# Patient Record
Sex: Female | Born: 1955 | Race: White | Hispanic: No | Marital: Married | Smoking: Never smoker
Health system: Southern US, Community
[De-identification: ages and names within clinical notes are randomized; demographics above are authoritative.]

## PROBLEM LIST (undated history)

## (undated) DIAGNOSIS — I10 Essential (primary) hypertension: Secondary | ICD-10-CM

## (undated) DIAGNOSIS — N289 Disorder of kidney and ureter, unspecified: Secondary | ICD-10-CM

## (undated) DIAGNOSIS — Z87442 Personal history of urinary calculi: Secondary | ICD-10-CM

## (undated) DIAGNOSIS — N189 Chronic kidney disease, unspecified: Secondary | ICD-10-CM

## (undated) DIAGNOSIS — H9190 Unspecified hearing loss, unspecified ear: Secondary | ICD-10-CM

## (undated) DIAGNOSIS — E7800 Pure hypercholesterolemia, unspecified: Secondary | ICD-10-CM

## (undated) DIAGNOSIS — G5600 Carpal tunnel syndrome, unspecified upper limb: Secondary | ICD-10-CM

## (undated) DIAGNOSIS — F419 Anxiety disorder, unspecified: Secondary | ICD-10-CM

## (undated) DIAGNOSIS — R112 Nausea with vomiting, unspecified: Secondary | ICD-10-CM

## (undated) DIAGNOSIS — Z9889 Other specified postprocedural states: Secondary | ICD-10-CM

## (undated) DIAGNOSIS — M810 Age-related osteoporosis without current pathological fracture: Secondary | ICD-10-CM

## (undated) HISTORY — DX: Chronic kidney disease, unspecified: N18.9

## (undated) HISTORY — PX: ESOPHAGOGASTRODUODENOSCOPY: SHX1529

## (undated) HISTORY — PX: TONSILLECTOMY: SUR1361

## (undated) HISTORY — PX: FOOT SURGERY: SHX648

## (undated) HISTORY — PX: KNEE SURGERY: SHX244

## (undated) HISTORY — PX: COLONOSCOPY: SHX174

## (undated) HISTORY — PX: ABDOMINAL HYSTERECTOMY: SHX81

## (undated) HISTORY — DX: Age-related osteoporosis without current pathological fracture: M81.0

## (undated) HISTORY — PX: CARPAL TUNNEL RELEASE: SHX101

## (undated) HISTORY — PX: APPENDECTOMY: SHX54

## (undated) HISTORY — PX: CHOLECYSTECTOMY: SHX55

## (undated) HISTORY — PX: OTHER SURGICAL HISTORY: SHX169

---

## 1998-01-29 ENCOUNTER — Emergency Department (HOSPITAL_COMMUNITY): Admission: EM | Admit: 1998-01-29 | Discharge: 1998-01-29 | Payer: Self-pay | Admitting: Emergency Medicine

## 1998-02-22 ENCOUNTER — Emergency Department (HOSPITAL_COMMUNITY): Admission: EM | Admit: 1998-02-22 | Discharge: 1998-02-22 | Payer: Self-pay | Admitting: Emergency Medicine

## 1998-04-29 ENCOUNTER — Emergency Department (HOSPITAL_COMMUNITY): Admission: EM | Admit: 1998-04-29 | Discharge: 1998-04-29 | Payer: Self-pay | Admitting: Emergency Medicine

## 1998-05-28 ENCOUNTER — Emergency Department (HOSPITAL_COMMUNITY): Admission: EM | Admit: 1998-05-28 | Discharge: 1998-05-28 | Payer: Self-pay | Admitting: Emergency Medicine

## 1998-06-10 ENCOUNTER — Emergency Department (HOSPITAL_COMMUNITY): Admission: EM | Admit: 1998-06-10 | Discharge: 1998-06-10 | Payer: Self-pay | Admitting: Emergency Medicine

## 1998-09-17 ENCOUNTER — Emergency Department (HOSPITAL_COMMUNITY): Admission: EM | Admit: 1998-09-17 | Discharge: 1998-09-17 | Payer: Self-pay | Admitting: Emergency Medicine

## 1999-01-01 ENCOUNTER — Emergency Department (HOSPITAL_COMMUNITY): Admission: EM | Admit: 1999-01-01 | Discharge: 1999-01-01 | Payer: Self-pay | Admitting: Emergency Medicine

## 2001-02-22 ENCOUNTER — Emergency Department (HOSPITAL_COMMUNITY): Admission: EM | Admit: 2001-02-22 | Discharge: 2001-02-22 | Payer: Self-pay | Admitting: Emergency Medicine

## 2001-03-19 ENCOUNTER — Emergency Department (HOSPITAL_COMMUNITY): Admission: EM | Admit: 2001-03-19 | Discharge: 2001-03-19 | Payer: Self-pay | Admitting: Emergency Medicine

## 2001-03-19 ENCOUNTER — Encounter: Payer: Self-pay | Admitting: Emergency Medicine

## 2001-03-29 ENCOUNTER — Emergency Department (HOSPITAL_COMMUNITY): Admission: EM | Admit: 2001-03-29 | Discharge: 2001-03-29 | Payer: Self-pay | Admitting: Emergency Medicine

## 2001-03-29 ENCOUNTER — Encounter: Payer: Self-pay | Admitting: Emergency Medicine

## 2001-05-30 ENCOUNTER — Emergency Department (HOSPITAL_COMMUNITY): Admission: EM | Admit: 2001-05-30 | Discharge: 2001-05-30 | Payer: Self-pay | Admitting: Emergency Medicine

## 2001-06-04 ENCOUNTER — Emergency Department (HOSPITAL_COMMUNITY): Admission: EM | Admit: 2001-06-04 | Discharge: 2001-06-04 | Payer: Self-pay | Admitting: Emergency Medicine

## 2001-09-18 ENCOUNTER — Encounter: Payer: Self-pay | Admitting: Emergency Medicine

## 2001-09-18 ENCOUNTER — Emergency Department (HOSPITAL_COMMUNITY): Admission: EM | Admit: 2001-09-18 | Discharge: 2001-09-18 | Payer: Self-pay | Admitting: Emergency Medicine

## 2001-09-30 ENCOUNTER — Emergency Department (HOSPITAL_COMMUNITY): Admission: EM | Admit: 2001-09-30 | Discharge: 2001-09-30 | Payer: Self-pay | Admitting: *Deleted

## 2001-09-30 ENCOUNTER — Encounter: Payer: Self-pay | Admitting: Emergency Medicine

## 2001-10-28 ENCOUNTER — Ambulatory Visit (HOSPITAL_COMMUNITY): Admission: RE | Admit: 2001-10-28 | Discharge: 2001-10-28 | Payer: Self-pay | Admitting: *Deleted

## 2001-10-28 ENCOUNTER — Encounter: Payer: Self-pay | Admitting: Specialist

## 2003-04-02 ENCOUNTER — Emergency Department (HOSPITAL_COMMUNITY): Admission: EM | Admit: 2003-04-02 | Discharge: 2003-04-02 | Payer: Self-pay | Admitting: *Deleted

## 2003-07-05 ENCOUNTER — Emergency Department (HOSPITAL_COMMUNITY): Admission: EM | Admit: 2003-07-05 | Discharge: 2003-07-05 | Payer: Self-pay | Admitting: Emergency Medicine

## 2003-09-10 ENCOUNTER — Emergency Department (HOSPITAL_COMMUNITY): Admission: EM | Admit: 2003-09-10 | Discharge: 2003-09-10 | Payer: Self-pay | Admitting: Emergency Medicine

## 2003-10-21 ENCOUNTER — Emergency Department (HOSPITAL_COMMUNITY): Admission: EM | Admit: 2003-10-21 | Discharge: 2003-10-21 | Payer: Self-pay | Admitting: Emergency Medicine

## 2004-08-04 ENCOUNTER — Emergency Department (HOSPITAL_COMMUNITY): Admission: EM | Admit: 2004-08-04 | Discharge: 2004-08-04 | Payer: Self-pay | Admitting: Emergency Medicine

## 2005-11-12 ENCOUNTER — Emergency Department (HOSPITAL_COMMUNITY): Admission: EM | Admit: 2005-11-12 | Discharge: 2005-11-12 | Payer: Self-pay | Admitting: Emergency Medicine

## 2005-12-07 ENCOUNTER — Emergency Department (HOSPITAL_COMMUNITY): Admission: EM | Admit: 2005-12-07 | Discharge: 2005-12-07 | Payer: Self-pay | Admitting: Emergency Medicine

## 2006-01-08 ENCOUNTER — Emergency Department (HOSPITAL_COMMUNITY): Admission: EM | Admit: 2006-01-08 | Discharge: 2006-01-08 | Payer: Self-pay | Admitting: *Deleted

## 2006-04-24 ENCOUNTER — Emergency Department (HOSPITAL_COMMUNITY): Admission: EM | Admit: 2006-04-24 | Discharge: 2006-04-24 | Payer: Self-pay | Admitting: Emergency Medicine

## 2006-06-09 ENCOUNTER — Emergency Department (HOSPITAL_COMMUNITY): Admission: EM | Admit: 2006-06-09 | Discharge: 2006-06-09 | Payer: Self-pay | Admitting: Emergency Medicine

## 2006-12-17 ENCOUNTER — Emergency Department (HOSPITAL_COMMUNITY): Admission: EM | Admit: 2006-12-17 | Discharge: 2006-12-17 | Payer: Self-pay | Admitting: Emergency Medicine

## 2006-12-27 ENCOUNTER — Emergency Department (HOSPITAL_COMMUNITY): Admission: EM | Admit: 2006-12-27 | Discharge: 2006-12-27 | Payer: Self-pay | Admitting: Emergency Medicine

## 2007-01-15 ENCOUNTER — Emergency Department (HOSPITAL_COMMUNITY): Admission: EM | Admit: 2007-01-15 | Discharge: 2007-01-15 | Payer: Self-pay | Admitting: Emergency Medicine

## 2007-02-28 ENCOUNTER — Emergency Department (HOSPITAL_COMMUNITY): Admission: EM | Admit: 2007-02-28 | Discharge: 2007-02-28 | Payer: Self-pay | Admitting: Emergency Medicine

## 2007-03-11 ENCOUNTER — Emergency Department (HOSPITAL_COMMUNITY): Admission: EM | Admit: 2007-03-11 | Discharge: 2007-03-11 | Payer: Self-pay | Admitting: Emergency Medicine

## 2007-05-06 ENCOUNTER — Emergency Department (HOSPITAL_COMMUNITY): Admission: EM | Admit: 2007-05-06 | Discharge: 2007-05-06 | Payer: Self-pay | Admitting: Emergency Medicine

## 2007-07-05 ENCOUNTER — Ambulatory Visit (HOSPITAL_COMMUNITY): Admission: RE | Admit: 2007-07-05 | Discharge: 2007-07-05 | Payer: Self-pay | Admitting: Internal Medicine

## 2007-07-12 ENCOUNTER — Encounter: Admission: RE | Admit: 2007-07-12 | Discharge: 2007-07-12 | Payer: Self-pay | Admitting: Family Medicine

## 2007-08-08 ENCOUNTER — Emergency Department (HOSPITAL_COMMUNITY): Admission: EM | Admit: 2007-08-08 | Discharge: 2007-08-08 | Payer: Self-pay | Admitting: Emergency Medicine

## 2007-09-13 ENCOUNTER — Ambulatory Visit (HOSPITAL_COMMUNITY): Admission: RE | Admit: 2007-09-13 | Discharge: 2007-09-13 | Payer: Self-pay | Admitting: *Deleted

## 2007-11-21 ENCOUNTER — Emergency Department (HOSPITAL_COMMUNITY): Admission: EM | Admit: 2007-11-21 | Discharge: 2007-11-21 | Payer: Self-pay | Admitting: Emergency Medicine

## 2007-11-30 ENCOUNTER — Emergency Department (HOSPITAL_COMMUNITY): Admission: EM | Admit: 2007-11-30 | Discharge: 2007-11-30 | Payer: Self-pay | Admitting: Emergency Medicine

## 2008-04-21 ENCOUNTER — Emergency Department (HOSPITAL_COMMUNITY): Admission: EM | Admit: 2008-04-21 | Discharge: 2008-04-21 | Payer: Self-pay | Admitting: Emergency Medicine

## 2008-06-30 IMAGING — MG MM DIAGNOSTIC UNILATERAL L
2 series · 2 of 2 positions shown · non-contrast
Comparison: Previous examinations at [HOSPITAL].

DG DIAGNOSTIC UNILATERAL L
CC and MLO view(s) were taken of the left breast.

LEFT BREAST ULTRASOUND
Technologist: Jardinero Elsy, Medical
DIGITAL UNILATERAL LEFT DIAGNOSTIC MAMMOGRAM AND LEFT BREAST ULTRASOUND:
CLINICAL DATA: Possible left breast mass at recent screening mammography.

[L CC]
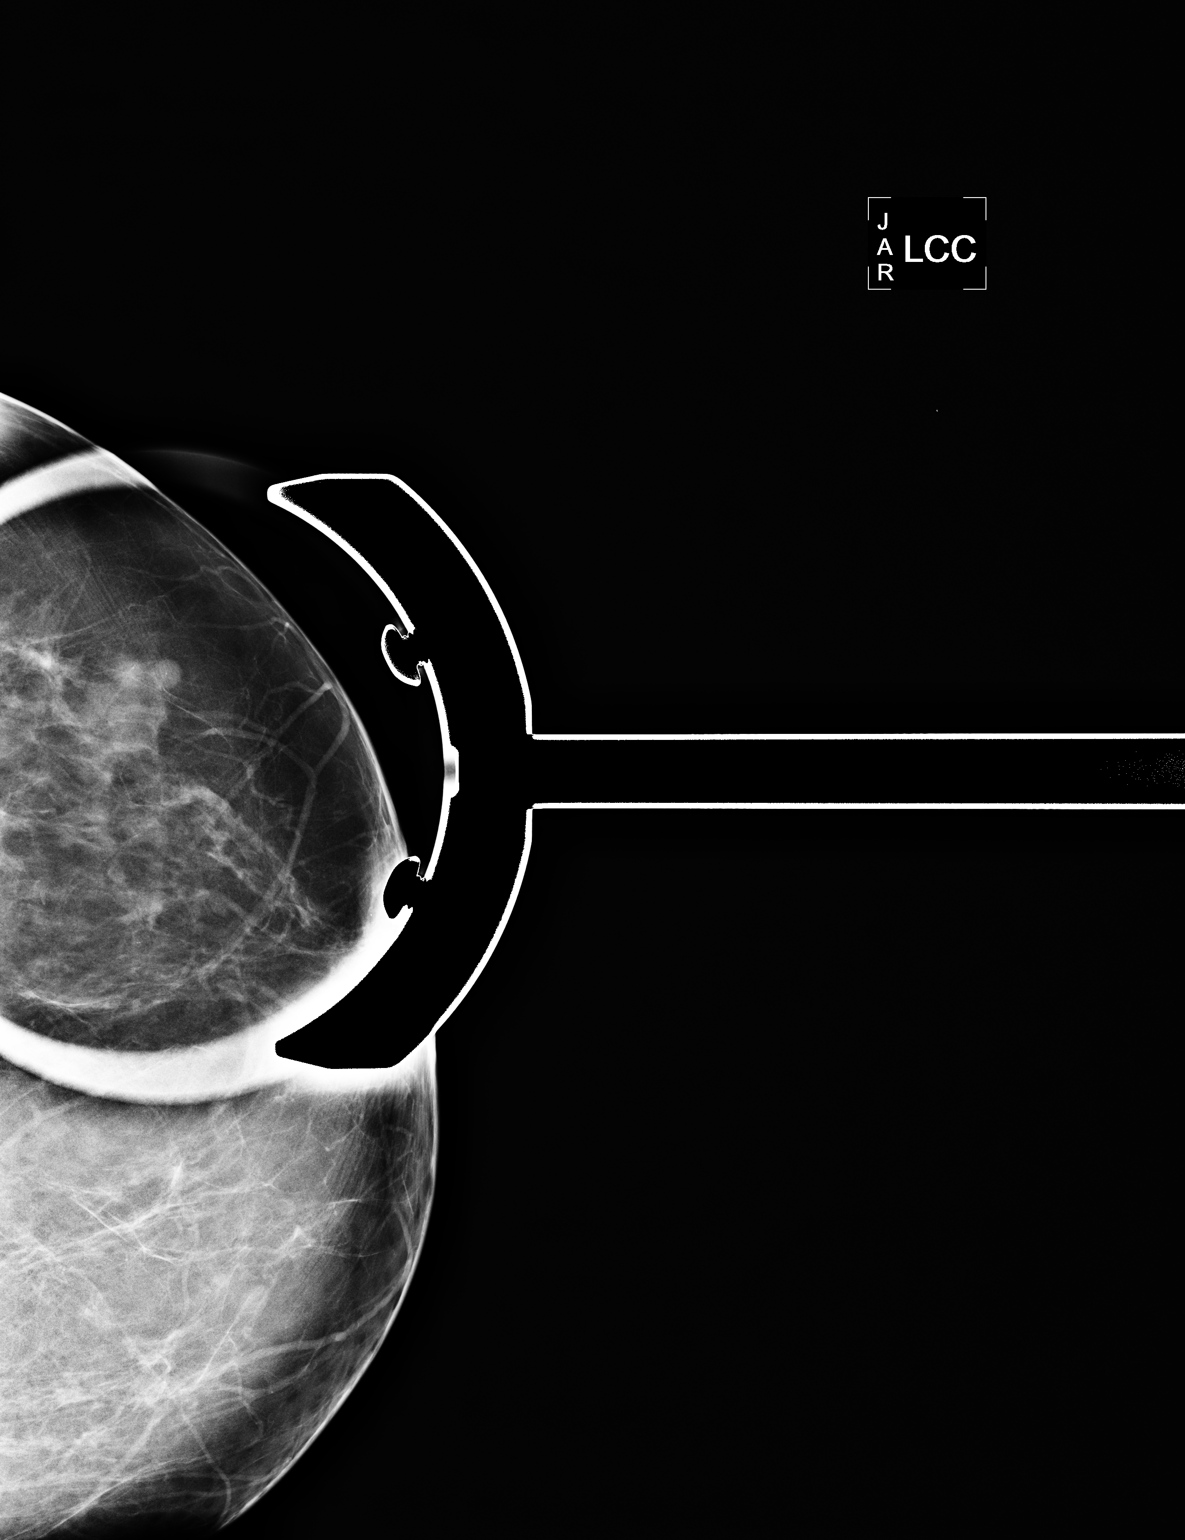

[L MLO]
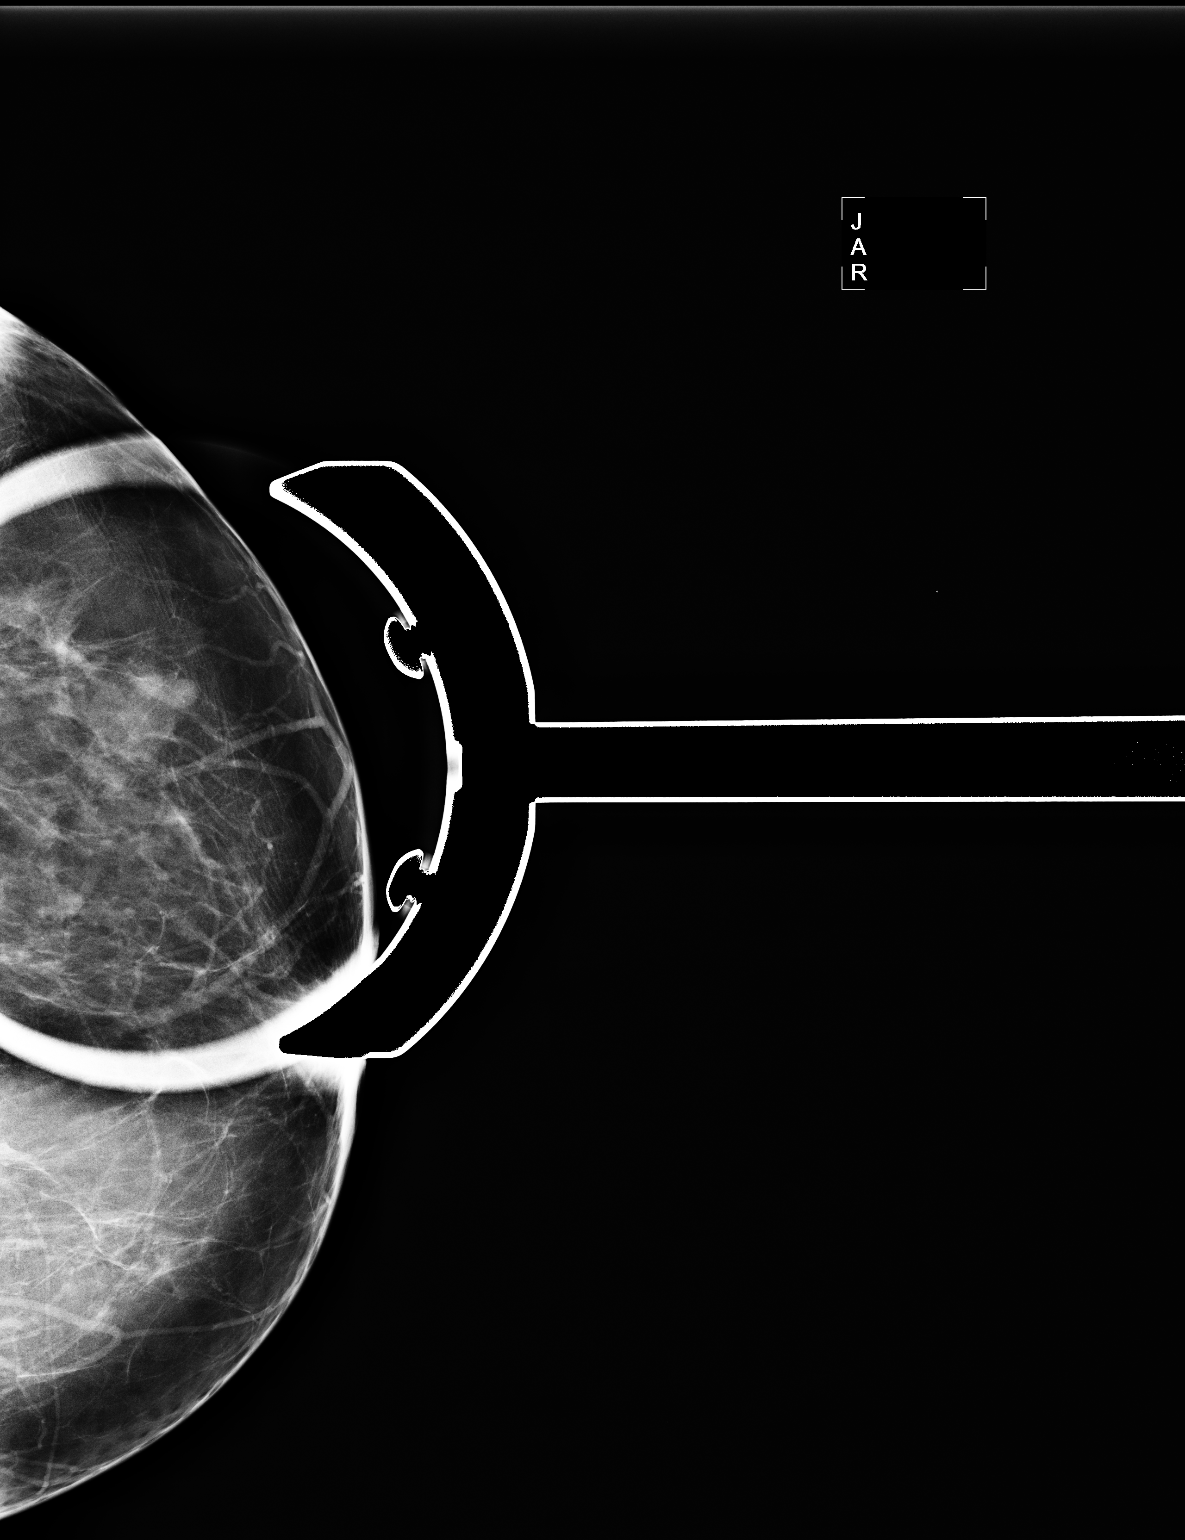

[2 of 2 positions shown; findings below may reference images not displayed]

Spot compression views of the left breast confirm a smoothly marginated, bilobed mass in the upper 
outer portion of the left breast.  This contains two punctate microcalcifications.

Physical examination of the upper outer portion of the left breast demonstrated no palpable 
abnormality.  Sonographic examination of that portion of the breast demonstrated two adjacent cysts
in the [DATE] position, approximately 4 cm from the nipple.  These each contain a punctate 
microcalcification and correspond to the mammographic abnormality.  One of these measures 5 mm in 
maximum diameter and the other measures 4 mm in maximum diameter.  No other abnormalities were 
seen.
IMPRESSION: Two adjacent left breast cysts.  No evidence of malignancy.  Screening mammography is recommended 
in one year.

ASSESSMENT: Benign - BI-RADS 2

Screening mammogram of both breasts in 1 year.
,

## 2008-06-30 IMAGING — US UNKNOWN US STUDY
1 series · 12 of 12 positions shown · non-contrast
Comparison: Previous examinations at [HOSPITAL].

DG DIAGNOSTIC UNILATERAL L
CC and MLO view(s) were taken of the left breast.

LEFT BREAST ULTRASOUND
Technologist: Jardinero Elsy, Medical
DIGITAL UNILATERAL LEFT DIAGNOSTIC MAMMOGRAM AND LEFT BREAST ULTRASOUND:
CLINICAL DATA: Possible left breast mass at recent screening mammography.

[Series 1: unknown us study · 12 of 12 slices shown]
[im 1/12]
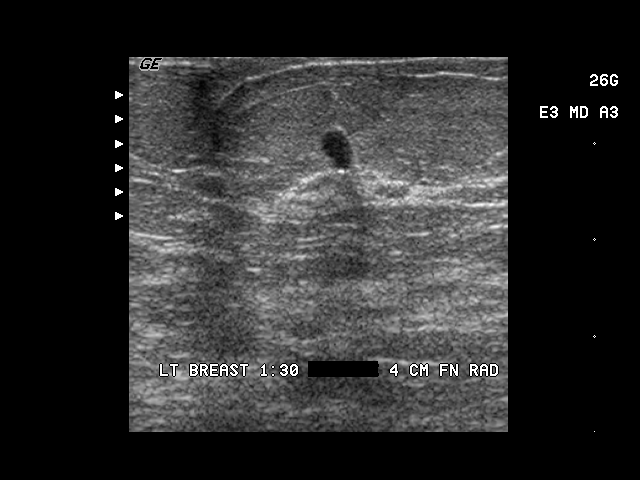
[im 2/12]
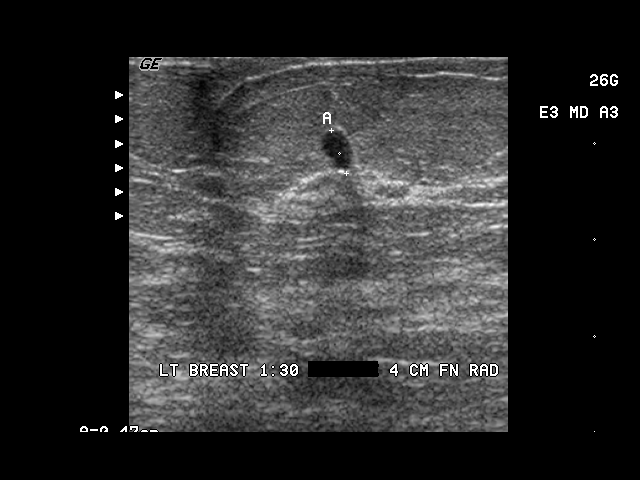
[im 3/12]
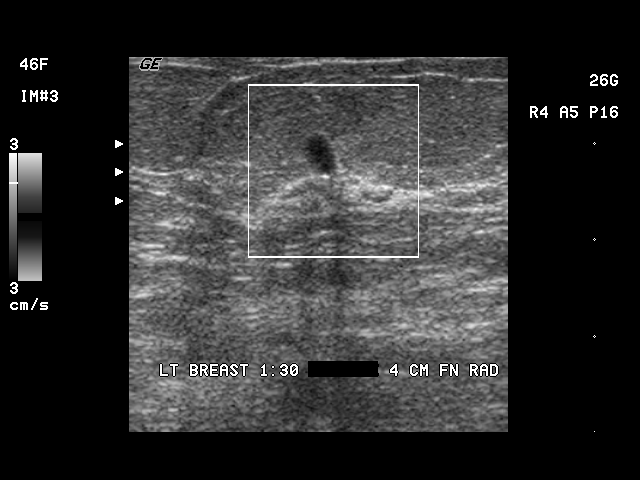
[im 4/12]
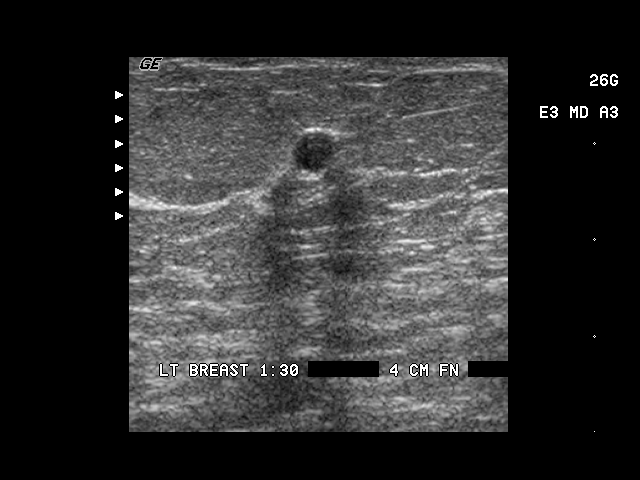
[im 5/12]
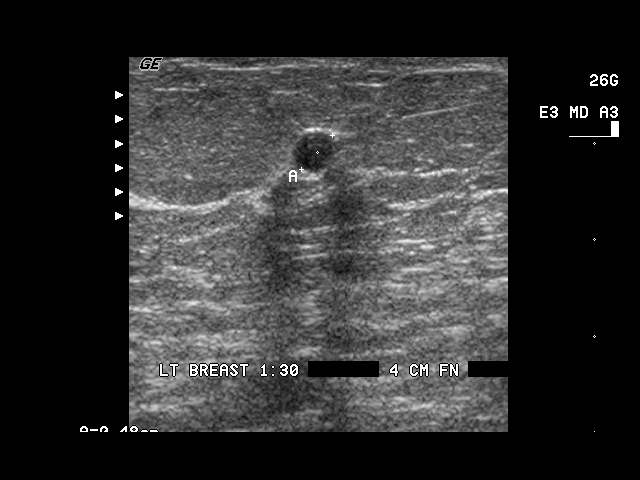
[im 6/12]
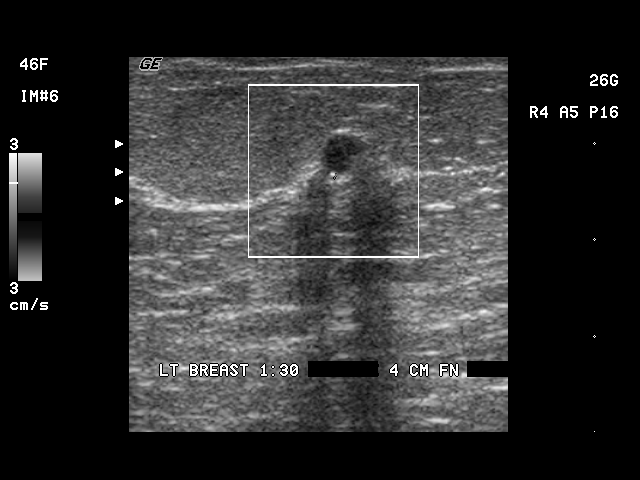
[im 7/12]
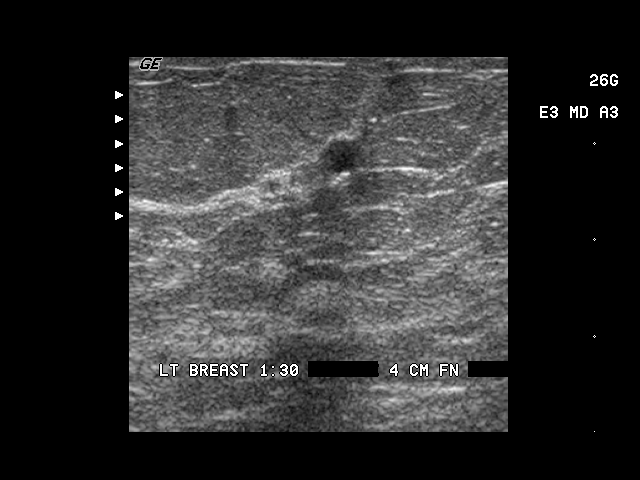
[im 8/12]
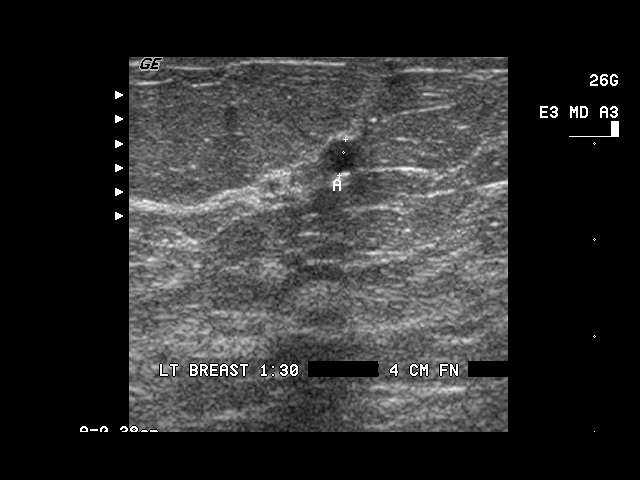
[im 9/12]
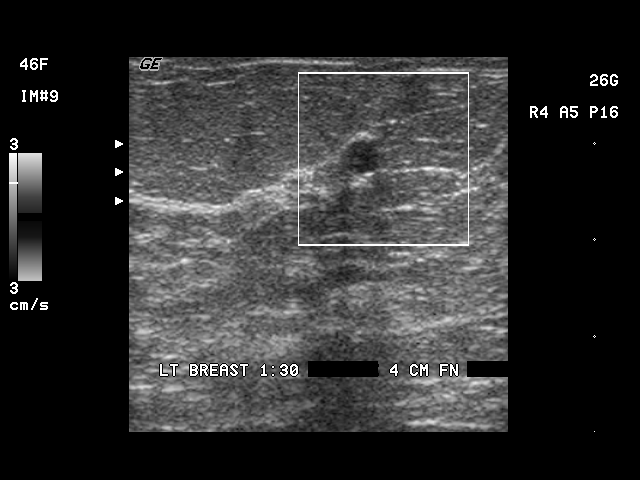
[im 10/12]
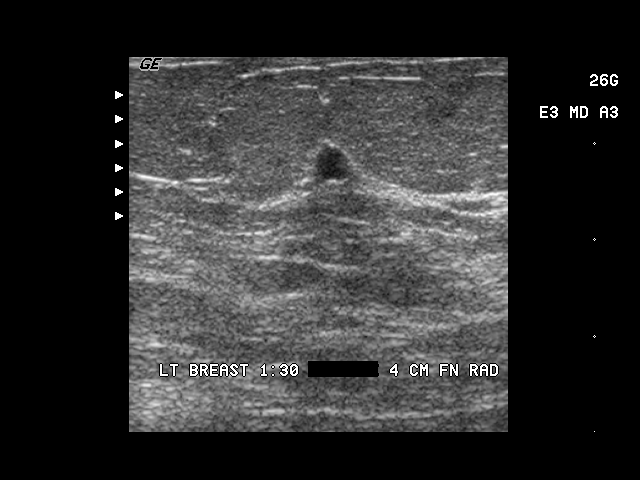
[im 11/12]
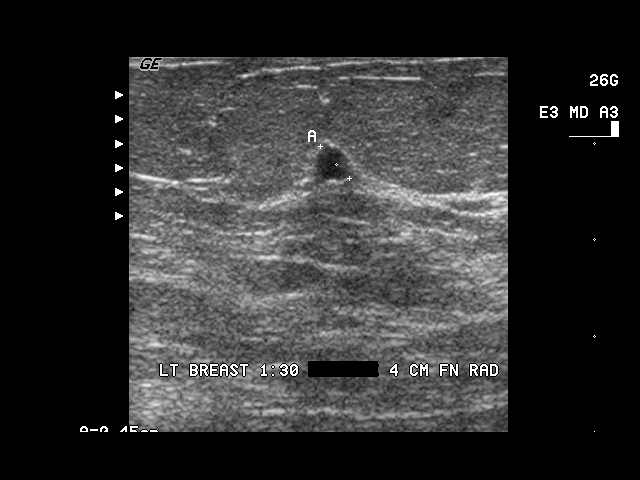
[im 12/12]
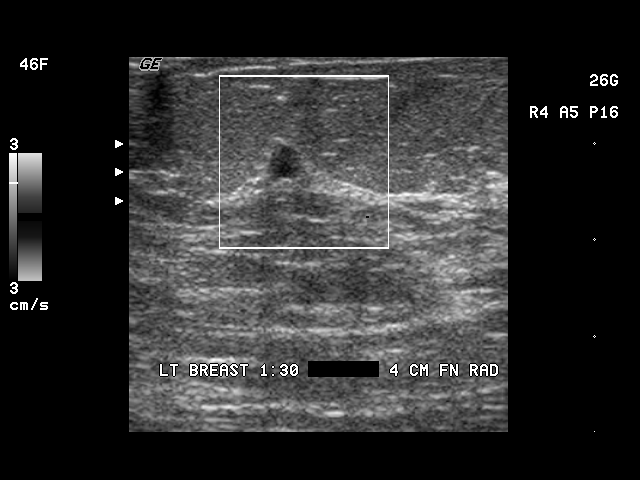

[12 of 12 positions shown; findings below may reference images not displayed]

Spot compression views of the left breast confirm a smoothly marginated, bilobed mass in the upper 
outer portion of the left breast.  This contains two punctate microcalcifications.

Physical examination of the upper outer portion of the left breast demonstrated no palpable 
abnormality.  Sonographic examination of that portion of the breast demonstrated two adjacent cysts
in the [DATE] position, approximately 4 cm from the nipple.  These each contain a punctate 
microcalcification and correspond to the mammographic abnormality.  One of these measures 5 mm in 
maximum diameter and the other measures 4 mm in maximum diameter.  No other abnormalities were 
seen.
IMPRESSION: Two adjacent left breast cysts.  No evidence of malignancy.  Screening mammography is recommended 
in one year.

ASSESSMENT: Benign - BI-RADS 2

Screening mammogram of both breasts in 1 year.
,

## 2008-07-05 ENCOUNTER — Ambulatory Visit (HOSPITAL_COMMUNITY): Admission: RE | Admit: 2008-07-05 | Discharge: 2008-07-05 | Payer: Self-pay | Admitting: Internal Medicine

## 2008-11-09 ENCOUNTER — Emergency Department (HOSPITAL_COMMUNITY): Admission: EM | Admit: 2008-11-09 | Discharge: 2008-11-09 | Payer: Self-pay | Admitting: Emergency Medicine

## 2009-04-03 ENCOUNTER — Emergency Department (HOSPITAL_COMMUNITY): Admission: EM | Admit: 2009-04-03 | Discharge: 2009-04-03 | Payer: Self-pay | Admitting: Emergency Medicine

## 2009-05-15 ENCOUNTER — Ambulatory Visit (HOSPITAL_COMMUNITY): Admission: RE | Admit: 2009-05-15 | Discharge: 2009-05-15 | Payer: Self-pay | Admitting: Internal Medicine

## 2009-06-15 ENCOUNTER — Emergency Department (HOSPITAL_COMMUNITY): Admission: EM | Admit: 2009-06-15 | Discharge: 2009-06-15 | Payer: Self-pay | Admitting: Emergency Medicine

## 2009-06-19 ENCOUNTER — Emergency Department (HOSPITAL_COMMUNITY): Admission: EM | Admit: 2009-06-19 | Discharge: 2009-06-19 | Payer: Self-pay | Admitting: Emergency Medicine

## 2009-07-22 ENCOUNTER — Emergency Department (HOSPITAL_COMMUNITY): Admission: EM | Admit: 2009-07-22 | Discharge: 2009-07-22 | Payer: Self-pay | Admitting: Emergency Medicine

## 2010-02-17 ENCOUNTER — Ambulatory Visit (HOSPITAL_COMMUNITY): Admission: RE | Admit: 2010-02-17 | Discharge: 2010-02-17 | Payer: Self-pay | Admitting: Internal Medicine

## 2010-03-09 ENCOUNTER — Emergency Department (HOSPITAL_COMMUNITY): Admission: EM | Admit: 2010-03-09 | Discharge: 2010-03-09 | Payer: Self-pay | Admitting: Emergency Medicine

## 2010-08-07 ENCOUNTER — Emergency Department (HOSPITAL_COMMUNITY): Admission: EM | Admit: 2010-08-07 | Discharge: 2010-08-07 | Payer: Self-pay | Admitting: Emergency Medicine

## 2010-08-18 ENCOUNTER — Emergency Department (HOSPITAL_COMMUNITY): Admission: EM | Admit: 2010-08-18 | Discharge: 2010-08-18 | Payer: Self-pay | Admitting: Emergency Medicine

## 2010-09-20 ENCOUNTER — Emergency Department (HOSPITAL_COMMUNITY): Admission: EM | Admit: 2010-09-20 | Discharge: 2010-09-21 | Payer: Self-pay | Admitting: Emergency Medicine

## 2010-10-20 ENCOUNTER — Emergency Department (HOSPITAL_COMMUNITY)
Admission: EM | Admit: 2010-10-20 | Discharge: 2010-10-20 | Payer: Self-pay | Source: Home / Self Care | Admitting: Emergency Medicine

## 2010-11-05 ENCOUNTER — Emergency Department (HOSPITAL_COMMUNITY)
Admission: EM | Admit: 2010-11-05 | Discharge: 2010-11-05 | Payer: Self-pay | Source: Home / Self Care | Admitting: Emergency Medicine

## 2010-11-10 LAB — URINALYSIS, ROUTINE W REFLEX MICROSCOPIC
Bilirubin Urine: NEGATIVE
Ketones, ur: NEGATIVE mg/dL
Nitrite: NEGATIVE
Protein, ur: 100 mg/dL — AB
Specific Gravity, Urine: 1.01 (ref 1.005–1.030)
Urine Glucose, Fasting: NEGATIVE mg/dL
Urobilinogen, UA: 1 mg/dL (ref 0.0–1.0)
pH: >9 — ABNORMAL HIGH (ref 5.0–8.0)

## 2010-11-10 LAB — DIFFERENTIAL
Basophils Absolute: 0.1 10*3/uL (ref 0.0–0.1)
Basophils Relative: 1 % (ref 0–1)
Eosinophils Absolute: 0.1 10*3/uL (ref 0.0–0.7)
Eosinophils Relative: 1 % (ref 0–5)
Lymphocytes Relative: 21 % (ref 12–46)
Lymphs Abs: 1.5 10*3/uL (ref 0.7–4.0)
Monocytes Absolute: 0.7 10*3/uL (ref 0.1–1.0)
Monocytes Relative: 10 % (ref 3–12)
Neutro Abs: 4.9 10*3/uL (ref 1.7–7.7)
Neutrophils Relative %: 67 % (ref 43–77)

## 2010-11-10 LAB — CBC
HCT: 35.8 % — ABNORMAL LOW (ref 36.0–46.0)
Hemoglobin: 12.4 g/dL (ref 12.0–15.0)
MCH: 30.6 pg (ref 26.0–34.0)
MCHC: 34.6 g/dL (ref 30.0–36.0)
MCV: 88.4 fL (ref 78.0–100.0)
Platelets: 232 10*3/uL (ref 150–400)
RBC: 4.05 MIL/uL (ref 3.87–5.11)
RDW: 14 % (ref 11.5–15.5)
WBC: 7.3 10*3/uL (ref 4.0–10.5)

## 2010-11-10 LAB — URINE MICROSCOPIC-ADD ON

## 2010-11-10 LAB — URINE CULTURE
Colony Count: >=100000
Culture  Setup Time: 201201111230

## 2010-11-10 LAB — BASIC METABOLIC PANEL
BUN: 14 mg/dL (ref 6–23)
CO2: 27 mEq/L (ref 19–32)
Calcium: 9.1 mg/dL (ref 8.4–10.5)
Chloride: 107 mEq/L (ref 96–112)
Creatinine, Ser: 0.66 mg/dL (ref 0.4–1.2)
GFR calc Af Amer: >60 mL/min (ref >60)
GFR calc non Af Amer: >60 mL/min (ref >60)
Glucose, Bld: 89 mg/dL (ref 70–99)
Potassium: 4 mEq/L (ref 3.5–5.1)
Sodium: 142 mEq/L (ref 135–145)

## 2011-01-12 LAB — URINE MICROSCOPIC-ADD ON

## 2011-01-12 LAB — URINALYSIS, ROUTINE W REFLEX MICROSCOPIC
Bilirubin Urine: NEGATIVE
Ketones, ur: NEGATIVE mg/dL
Leukocytes, UA: NEGATIVE
Protein, ur: NEGATIVE mg/dL
Specific Gravity, Urine: >1.03 — ABNORMAL HIGH (ref 1.005–1.030)
pH: 5.5 (ref 5.0–8.0)

## 2011-01-31 LAB — URINALYSIS, ROUTINE W REFLEX MICROSCOPIC
Bilirubin Urine: NEGATIVE
Bilirubin Urine: NEGATIVE
Glucose, UA: NEGATIVE mg/dL
Ketones, ur: NEGATIVE mg/dL
Ketones, ur: NEGATIVE mg/dL
Leukocytes, UA: NEGATIVE
Leukocytes, UA: NEGATIVE
Nitrite: NEGATIVE
Nitrite: NEGATIVE
Protein, ur: NEGATIVE mg/dL
pH: 6 (ref 5.0–8.0)
pH: 6.5 (ref 5.0–8.0)

## 2011-01-31 LAB — URINE MICROSCOPIC-ADD ON

## 2011-02-04 ENCOUNTER — Emergency Department (HOSPITAL_COMMUNITY)
Admission: EM | Admit: 2011-02-04 | Discharge: 2011-02-04 | Disposition: A | Discharge status: Home / Self Care | Payer: Self-pay | Attending: Emergency Medicine | Admitting: Emergency Medicine

## 2011-02-04 DIAGNOSIS — M25519 Pain in unspecified shoulder: Secondary | ICD-10-CM | POA: Insufficient documentation

## 2011-02-04 DIAGNOSIS — E7800 Pure hypercholesterolemia, unspecified: Secondary | ICD-10-CM | POA: Insufficient documentation

## 2011-02-04 DIAGNOSIS — M67919 Unspecified disorder of synovium and tendon, unspecified shoulder: Secondary | ICD-10-CM | POA: Insufficient documentation

## 2011-02-04 DIAGNOSIS — M719 Bursopathy, unspecified: Secondary | ICD-10-CM | POA: Insufficient documentation

## 2011-02-04 DIAGNOSIS — I10 Essential (primary) hypertension: Secondary | ICD-10-CM | POA: Insufficient documentation

## 2011-03-10 NOTE — Procedures (Signed)
Sonia Jackson, Sonia Jackson              ACCOUNT NO.:  [id number]   MEDICAL RECORD NO.:  [id number]          PATIENT TYPE:  OUT   LOCATION:  RAD                           FACILITY:  [facility]   PHYSICIAN:  Dani Gobble, MD       DATE OF BIRTH:  30-Oct-1955   DATE OF PROCEDURE:  09/13/2007  DATE OF DISCHARGE:                                  STRESS TEST   REFERRING PHYSICIANS:  Dr. Catalina Pizza and Dr. Daphene Jaeger   INDICATION:  Palpitations.   HEMODYNAMIC/ELECTROCARDIOGRAPHIC DATA:  Baseline blood pressure 132/80  mmHg, the pulse 58 beats per minute.  Baseline 12-lead EKG reveals  normal sinus rhythm without ischemic changes noted.   The patient walked for 6 minutes and 24 seconds on a full Bruce  protocol, surpassing her target heart rate of 145 beats per minute  proceeding to a peak heart rate of approximately 151 beats per minute.  She experienced some dyspnea at peak exercise.  She had no chest  discomfort.  She denied any palpitations other than an increased heart  rate.  She denied any symptoms to suggest claudication.  EKG was  negative for ischemia, and no ectopy was appreciated either during her  exercise or during the recovery phase.   IMPRESSION:  1. Clinically negative for angina.  2. EKG negative for ischemia.  3. Mild to moderate exercise intolerance.  4. Scintigraphic images are pending.           ______________________________  Dani Gobble, MD     AB/MEDQ  D:  09/13/2007  T:  09/13/2007  Job:  [id number]   cc:   Catalina Pizza, M.D.  Fax: [phone]   Nicki Guadalajara, M.D.  Fax: [phone]

## 2011-03-10 NOTE — Procedures (Signed)
[name], Sonia Jackson              ACCOUNT NO.:  [id number]   MEDICAL RECORD NO.:  [id number]          PATIENT TYPE:  OUT   LOCATION:  RAD                           FACILITY:  [facility]   PHYSICIAN:  Dani Gobble, MD       DATE OF BIRTH:  Aug 12, 1956   DATE OF PROCEDURE:  09/13/2007  DATE OF DISCHARGE:                                ECHOCARDIOGRAM   REFERRING:  Dr. Nicki Guadalajara.   INDICATIONS:  A 55 year old female with chest discomfort.   Technical quality of the study is adequate.   The aorta measures normally at 2.5 cm.   Left atrium also measures normally at 3.5 cm.  The patient appeared be  in sinus rhythm during this procedure.  No obvious clots or masses are  noted.   Interventricular septum and posterior wall are within normal limits in  thickness, although she does have minimal basal septal hypertrophy  without LVOT obstruction.   The aortic valve is probably trileaflet, although all leaflets were not  well visualized.  The aortic valve opening is normal.  Doppler  interrogation of the aortic valve is within normal limits.  In one view  only, there is a suggestion of trivial aortic insufficiency which is not  noted elsewhere.   The mitral valve is structurally normal.  No mitral valve prolapse is  noted.  Mild mitral regurgitation noted.  Doppler interrogation of  mitral valve is within normal limits.   The pulmonic valve is grossly structurally normal.   Tricuspid valve also is grossly structurally normal with mild tricuspid  regurgitation.   The left ventricle is normal in size with the LVIDD measured at 3.7 cm  and the LVISD measured at 2.8 cm.  Overall, left systolic function is  normal.  No regional wall motion abnormalities are noted.  The right  atrium and right ventricle are normal in size, and right ventricular  systolic function is normal.   IMPRESSION:  1. Minimal basal septal hypertrophy without LVOT obstruction noted.  2. Mild mitral and tricuspid  regurgitation.  3. Normal left ventricular size and systolic function without regional      wall motion abnormality noted.           ______________________________  Dani Gobble, MD     AB/MEDQ  D:  09/13/2007  T:  09/14/2007  Job:  [id number]   cc:   Nicki Guadalajara, M.D.  Fax: [phone]

## 2011-03-17 ENCOUNTER — Other Ambulatory Visit: Payer: Self-pay | Admitting: Obstetrics and Gynecology

## 2011-03-17 ENCOUNTER — Emergency Department (HOSPITAL_COMMUNITY)
Admission: EM | Admit: 2011-03-17 | Discharge: 2011-03-17 | Disposition: A | Discharge status: Home / Self Care | Payer: Self-pay | Attending: Emergency Medicine | Admitting: Emergency Medicine

## 2011-03-17 DIAGNOSIS — Z79899 Other long term (current) drug therapy: Secondary | ICD-10-CM | POA: Insufficient documentation

## 2011-03-17 DIAGNOSIS — I10 Essential (primary) hypertension: Secondary | ICD-10-CM | POA: Insufficient documentation

## 2011-03-17 DIAGNOSIS — Z139 Encounter for screening, unspecified: Secondary | ICD-10-CM

## 2011-03-17 DIAGNOSIS — R109 Unspecified abdominal pain: Secondary | ICD-10-CM | POA: Insufficient documentation

## 2011-03-17 DIAGNOSIS — E7800 Pure hypercholesterolemia, unspecified: Secondary | ICD-10-CM | POA: Insufficient documentation

## 2011-03-17 DIAGNOSIS — N390 Urinary tract infection, site not specified: Secondary | ICD-10-CM | POA: Insufficient documentation

## 2011-03-17 LAB — DIFFERENTIAL
Lymphocytes Relative: 18 % (ref 12–46)
Lymphs Abs: 1.9 10*3/uL (ref 0.7–4.0)
Monocytes Absolute: 1.2 10*3/uL — ABNORMAL HIGH (ref 0.1–1.0)
Neutro Abs: 7.4 10*3/uL (ref 1.7–7.7)
Neutrophils Relative %: 70 % (ref 43–77)

## 2011-03-17 LAB — URINE MICROSCOPIC-ADD ON

## 2011-03-17 LAB — CBC
MCHC: 33.9 g/dL (ref 30.0–36.0)
MCV: 88.1 fL (ref 78.0–100.0)
Platelets: 197 10*3/uL (ref 150–400)
WBC: 10.6 10*3/uL — ABNORMAL HIGH (ref 4.0–10.5)

## 2011-03-17 LAB — URINALYSIS, ROUTINE W REFLEX MICROSCOPIC
Bilirubin Urine: NEGATIVE
Glucose, UA: NEGATIVE mg/dL
Urobilinogen, UA: 0.2 mg/dL (ref 0.0–1.0)

## 2011-03-17 LAB — BASIC METABOLIC PANEL
CO2: 32 mEq/L (ref 19–32)
Calcium: 10.8 mg/dL — ABNORMAL HIGH (ref 8.4–10.5)
Chloride: 99 mEq/L (ref 96–112)
Creatinine, Ser: 0.74 mg/dL (ref 0.4–1.2)
Glucose, Bld: 93 mg/dL (ref 70–99)
Sodium: 139 mEq/L (ref 135–145)

## 2011-03-20 LAB — URINE CULTURE: Colony Count: >=100000

## 2011-03-30 ENCOUNTER — Ambulatory Visit (HOSPITAL_COMMUNITY)
Admission: RE | Admit: 2011-03-30 | Discharge: 2011-03-30 | Disposition: A | Discharge status: Home / Self Care | Payer: Self-pay | Source: Ambulatory Visit | Attending: Obstetrics and Gynecology | Admitting: Obstetrics and Gynecology

## 2011-03-30 DIAGNOSIS — Z139 Encounter for screening, unspecified: Secondary | ICD-10-CM

## 2011-06-05 ENCOUNTER — Encounter: Payer: Self-pay | Admitting: *Deleted

## 2011-06-05 ENCOUNTER — Emergency Department (HOSPITAL_COMMUNITY)
Admission: EM | Admit: 2011-06-05 | Discharge: 2011-06-05 | Disposition: A | Discharge status: Home / Self Care | Payer: Self-pay | Attending: Emergency Medicine | Admitting: Emergency Medicine

## 2011-06-05 DIAGNOSIS — I10 Essential (primary) hypertension: Secondary | ICD-10-CM | POA: Insufficient documentation

## 2011-06-05 DIAGNOSIS — K3189 Other diseases of stomach and duodenum: Secondary | ICD-10-CM | POA: Insufficient documentation

## 2011-06-05 DIAGNOSIS — E7800 Pure hypercholesterolemia, unspecified: Secondary | ICD-10-CM | POA: Insufficient documentation

## 2011-06-05 DIAGNOSIS — R1013 Epigastric pain: Secondary | ICD-10-CM | POA: Insufficient documentation

## 2011-06-05 HISTORY — DX: Pure hypercholesterolemia, unspecified: E78.00

## 2011-06-05 HISTORY — DX: Essential (primary) hypertension: I10

## 2011-06-05 LAB — COMPREHENSIVE METABOLIC PANEL
ALT: 13 U/L (ref 0–35)
AST: 16 U/L (ref 0–37)
Albumin: 3.9 g/dL (ref 3.5–5.2)
Alkaline Phosphatase: 79 U/L (ref 39–117)
Chloride: 99 mEq/L (ref 96–112)
Potassium: 3.2 mEq/L — ABNORMAL LOW (ref 3.5–5.1)
Sodium: 139 mEq/L (ref 135–145)
Total Protein: 7.8 g/dL (ref 6.0–8.3)

## 2011-06-05 LAB — CBC
Platelets: 206 10*3/uL (ref 150–400)
RBC: 4.58 MIL/uL (ref 3.87–5.11)
RDW: 12.9 % (ref 11.5–15.5)
WBC: 8.3 10*3/uL (ref 4.0–10.5)

## 2011-06-05 MED ORDER — PANTOPRAZOLE SODIUM 40 MG IV SOLR
40.0000 mg | Freq: Once | INTRAVENOUS | Status: AC
  Administered 2011-06-05: 40 mg via INTRAVENOUS
  Filled 2011-06-05: qty 40

## 2011-06-05 MED ORDER — SODIUM CHLORIDE 0.9 % IV SOLN: INTRAVENOUS | Status: DC

## 2011-06-05 MED ORDER — FAMOTIDINE IN NACL 20-0.9 MG/50ML-% IV SOLN
20.0000 mg | Freq: Once | INTRAVENOUS | Status: AC
  Administered 2011-06-05: 20 mg via INTRAVENOUS
  Filled 2011-06-05: qty 50

## 2011-06-05 MED ORDER — FAMOTIDINE 20 MG PO TABS: 20.0000 mg | ORAL_TABLET | Freq: Two times a day (BID) | ORAL | Status: DC

## 2011-06-05 MED ORDER — GI COCKTAIL ~~LOC~~
30.0000 mL | Freq: Once | ORAL | Status: AC
  Administered 2011-06-05: 30 mL via ORAL
  Filled 2011-06-05: qty 30

## 2011-06-05 NOTE — ED Notes (Signed)
Pt requesting a note for work, Dr. Hyacinth Meeker informed and stated pt should be able to work this weekend

## 2011-06-05 NOTE — ED Notes (Signed)
Pt c/o burning and pain in her upper abdomen for several days. States that she lost her Father recently and she has been having problems since. C/o Nausea but denies vomiting. No acute distress.

## 2011-06-05 NOTE — ED Provider Notes (Signed)
History     CSN: [id number] Arrival date & time: 06/05/2011  6:01 PM  Chief Complaint  Patient presents with  . Abdominal Pain   HPI Comments: Patient is a very pleasant 55 year old female who presents with a complaint of epigastric burning times the last 3 days. She states that she does have a history of a stomach ulcer and has a history of several days of being very stressed out due to the loss of her father to a heart attack one week. She has mild burning at this time which seems to get worse when she needs. This is associated with nausea. There is no radiation of the pain. She denies associated fevers, chills, cough, shortness of breath, swelling, rash, dysuria, diarrhea. Currently the symptoms are mild, 1/10.  Patient is a 55 y.o. female presenting with abdominal pain. The history is provided by the patient.  Abdominal Pain The primary symptoms of the illness include abdominal pain.    Past Medical History  Diagnosis Date  . Hypertension   . High cholesterol     Past Surgical History  Procedure Date  . Knee surgery   . Foot surgery   . Arm surgery   . Abdominal hysterectomy   . Cholecystectomy   . Appendectomy     No family history on file.  History  Substance Use Topics  . Smoking status: Never Smoker   . Smokeless tobacco: Not on file  . Alcohol Use: No    OB History    Grav Para Term Preterm Abortions TAB SAB Ect Mult Living                  Review of Systems  Gastrointestinal: Positive for abdominal pain.  All other systems reviewed and are negative.    Physical Exam  BP 116/69  Pulse 65  Temp(Src) 98 F (36.7 C) (Oral)  Resp 20  Ht 4\' 11"  (1.499 m)  Wt 132 lb (59.875 kg)  BMI 26.66 kg/m2  SpO2 100%  Physical Exam  Nursing note and vitals reviewed. Constitutional: She appears well-developed and well-nourished. No distress.  HENT:  Head: Normocephalic and atraumatic.  Mouth/Throat: Oropharynx is clear and moist. No oropharyngeal exudate.    Eyes: Conjunctivae and EOM are normal. Pupils are equal, round, and reactive to light. Right eye exhibits no discharge. Left eye exhibits no discharge. No scleral icterus.  Neck: Normal range of motion. Neck supple. No JVD present. No thyromegaly present.  Cardiovascular: Normal rate, regular rhythm, normal heart sounds and intact distal pulses.  Exam reveals no gallop and no friction rub.   No murmur heard. Pulmonary/Chest: Effort normal and breath sounds normal. No respiratory distress. She has no wheezes. She has no rales.  Abdominal: Soft. Bowel sounds are normal. She exhibits no distension and no mass. There is tenderness (mild tenderness to palpation in the epigastrium without guarding. No other abdominal tenderness to palpation, non-peritoneal.).       No CVA tenderness  Musculoskeletal: Normal range of motion. She exhibits no edema and no tenderness.  Lymphadenopathy:    She has no cervical adenopathy.  Neurological: She is alert. Coordination normal.  Skin: Skin is warm and dry. No rash noted. No erythema.  Psychiatric: She has a normal mood and affect. Her behavior is normal.    ED Course  Procedures  MDM Overall the patient is well-appearing with some mild epigastric pain. We'll rule out pancreatitis treat for peptic ulcer disease. No imaging is indicated at this time.  Vida Roller, MD 06/05/11 2010

## 2011-06-05 NOTE — ED Notes (Signed)
Mid abd pain with nausea x 2 days.  States has been under stress due to recent death and thinks she may have ulcer.  Denies vomiting.

## 2011-06-05 NOTE — Discharge Instructions (Addendum)
Please take the medications prescribed and return to see her family physician for Ongoing Symptoms. Return to the Emergency Department Immediately for Severe or Worsening Symptoms. Your blood work was normal without any signs of abnormality.

## 2012-03-17 ENCOUNTER — Emergency Department (HOSPITAL_COMMUNITY): Payer: Self-pay

## 2012-03-17 ENCOUNTER — Encounter (HOSPITAL_COMMUNITY): Payer: Self-pay | Admitting: *Deleted

## 2012-03-17 ENCOUNTER — Emergency Department (HOSPITAL_COMMUNITY)
Admission: EM | Admit: 2012-03-17 | Discharge: 2012-03-17 | Disposition: A | Discharge status: Home / Self Care | Payer: Self-pay | Attending: Emergency Medicine | Admitting: Emergency Medicine

## 2012-03-17 DIAGNOSIS — R110 Nausea: Secondary | ICD-10-CM | POA: Insufficient documentation

## 2012-03-17 DIAGNOSIS — Z79899 Other long term (current) drug therapy: Secondary | ICD-10-CM | POA: Insufficient documentation

## 2012-03-17 DIAGNOSIS — R350 Frequency of micturition: Secondary | ICD-10-CM | POA: Insufficient documentation

## 2012-03-17 DIAGNOSIS — I10 Essential (primary) hypertension: Secondary | ICD-10-CM | POA: Insufficient documentation

## 2012-03-17 DIAGNOSIS — E7800 Pure hypercholesterolemia, unspecified: Secondary | ICD-10-CM | POA: Insufficient documentation

## 2012-03-17 DIAGNOSIS — R300 Dysuria: Secondary | ICD-10-CM | POA: Insufficient documentation

## 2012-03-17 DIAGNOSIS — N390 Urinary tract infection, site not specified: Secondary | ICD-10-CM | POA: Insufficient documentation

## 2012-03-17 DIAGNOSIS — R3915 Urgency of urination: Secondary | ICD-10-CM | POA: Insufficient documentation

## 2012-03-17 DIAGNOSIS — R109 Unspecified abdominal pain: Secondary | ICD-10-CM | POA: Insufficient documentation

## 2012-03-17 HISTORY — DX: Disorder of kidney and ureter, unspecified: N28.9

## 2012-03-17 HISTORY — DX: Carpal tunnel syndrome, unspecified upper limb: G56.00

## 2012-03-17 LAB — URINALYSIS, ROUTINE W REFLEX MICROSCOPIC
Bilirubin Urine: NEGATIVE
Glucose, UA: NEGATIVE mg/dL
Ketones, ur: NEGATIVE mg/dL
pH: 6.5 (ref 5.0–8.0)

## 2012-03-17 LAB — URINE MICROSCOPIC-ADD ON

## 2012-03-17 LAB — BASIC METABOLIC PANEL
BUN: 18 mg/dL (ref 6–23)
CO2: 28 mEq/L (ref 19–32)
Chloride: 103 mEq/L (ref 96–112)
Creatinine, Ser: 0.83 mg/dL (ref 0.50–1.10)
GFR calc Af Amer: >90 mL/min (ref >90)

## 2012-03-17 LAB — DIFFERENTIAL
Basophils Relative: 1 % (ref 0–1)
Monocytes Absolute: 0.7 10*3/uL (ref 0.1–1.0)
Monocytes Relative: 9 % (ref 3–12)
Neutro Abs: 4.7 10*3/uL (ref 1.7–7.7)

## 2012-03-17 LAB — CBC
HCT: 38.6 % (ref 36.0–46.0)
Hemoglobin: 13.2 g/dL (ref 12.0–15.0)
MCHC: 34.2 g/dL (ref 30.0–36.0)
MCV: 87.5 fL (ref 78.0–100.0)

## 2012-03-17 MED ORDER — ONDANSETRON HCL 4 MG/2ML IJ SOLN
4.0000 mg | Freq: Once | INTRAMUSCULAR | Status: AC
  Administered 2012-03-17: 4 mg via INTRAVENOUS
  Filled 2012-03-17: qty 2

## 2012-03-17 MED ORDER — CEPHALEXIN 500 MG PO CAPS: 500.0000 mg | ORAL_CAPSULE | Freq: Three times a day (TID) | ORAL | Status: AC

## 2012-03-17 MED ORDER — SODIUM CHLORIDE 0.9 % IV SOLN
Freq: Once | INTRAVENOUS | Status: AC
  Administered 2012-03-17: 18:00:00 via INTRAVENOUS

## 2012-03-17 MED ORDER — KETOROLAC TROMETHAMINE 30 MG/ML IJ SOLN
30.0000 mg | Freq: Once | INTRAMUSCULAR | Status: AC
  Administered 2012-03-17: 30 mg via INTRAVENOUS
  Filled 2012-03-17: qty 1

## 2012-03-17 MED ORDER — HYDROMORPHONE HCL PF 1 MG/ML IJ SOLN
1.0000 mg | Freq: Once | INTRAMUSCULAR | Status: AC
  Administered 2012-03-17: 1 mg via INTRAVENOUS
  Filled 2012-03-17: qty 1

## 2012-03-17 MED ORDER — OXYCODONE-ACETAMINOPHEN 5-325 MG PO TABS: 1.0000 | ORAL_TABLET | ORAL | Status: AC | PRN

## 2012-03-17 MED ORDER — OXYCODONE-ACETAMINOPHEN 5-325 MG PO TABS
1.0000 | ORAL_TABLET | Freq: Once | ORAL | Status: AC
  Administered 2012-03-17: 1 via ORAL
  Filled 2012-03-17: qty 1

## 2012-03-17 MED ORDER — METOCLOPRAMIDE HCL 10 MG PO TABS: 10.0000 mg | ORAL_TABLET | Freq: Four times a day (QID) | ORAL | Status: DC | PRN

## 2012-03-17 MED ORDER — DEXTROSE 5 % IV SOLN
1.0000 g | INTRAVENOUS | Status: DC
  Administered 2012-03-17: 1 g via INTRAVENOUS
  Filled 2012-03-17: qty 10

## 2012-03-17 NOTE — ED Notes (Signed)
Pt ambulatory and stable at discharge Pt instructed not to drive while on pain medication

## 2012-03-17 NOTE — ED Notes (Signed)
Lt flank pain with nausea.

## 2012-03-17 NOTE — Discharge Instructions (Signed)
Your CAT scan shows that there are small stones in your kidneys. However, these will not cause pain in children they leave the kidney and start moving toward the bladder. Your pain today is most likely from a kidney infection. Please take the antibiotic as prescribed. Return to the emergency department if pain is not being controlled, UR vomiting in spite of the nausea medicine, or if you start running a high fever.  Urinary Tract Infection Infections of the urinary tract can start in several places. A bladder infection (cystitis), a kidney infection (pyelonephritis), and a prostate infection (prostatitis) are different types of urinary tract infections (UTIs). They usually get better if treated with medicines (antibiotics) that kill germs. Take all the medicine until it is gone. You or your child may feel better in a few days, but TAKE ALL MEDICINE or the infection may not respond and may become more difficult to treat. HOME CARE INSTRUCTIONS   Drink enough water and fluids to keep the urine clear or pale yellow. Cranberry juice is especially recommended, in addition to large amounts of water.   Avoid caffeine, tea, and carbonated beverages. They tend to irritate the bladder.   Alcohol may irritate the prostate.   Only take over-the-counter or prescription medicines for pain, discomfort, or fever as directed by your caregiver.  To prevent further infections:  Empty the bladder often. Avoid holding urine for long periods of time.   After a bowel movement, women should cleanse from front to back. Use each tissue only once.   Empty the bladder before and after sexual intercourse.  FINDING OUT THE RESULTS OF YOUR TEST Not all test results are available during your visit. If your or your child's test results are not back during the visit, make an appointment with your caregiver to find out the results. Do not assume everything is normal if you have not heard from your caregiver or the medical  facility. It is important for you to follow up on all test results. SEEK MEDICAL CARE IF:   There is back pain.   Your baby is older than 3 months with a rectal temperature of 100.5 F (38.1 C) or higher for more than 1 day.   Your or your child's problems (symptoms) are no better in 3 days. Return sooner if you or your child is getting worse.  SEEK IMMEDIATE MEDICAL CARE IF:   There is severe back pain or lower abdominal pain.   You or your child develops chills.   You have a fever.   Your baby is older than 3 months with a rectal temperature of 102 F (38.9 C) or higher.   Your baby is 85 months old or younger with a rectal temperature of 100.4 F (38 C) or higher.   There is nausea or vomiting.   There is continued burning or discomfort with urination.  MAKE SURE YOU:   Understand these instructions.   Will watch your condition.   Will get help right away if you are not doing well or get worse.  Document Released: 07/22/2005 Document Revised: 10/01/2011 Document Reviewed: 02/24/2007 [facility] Patient Information 2012 [place], [place].  Cephalexin tablets or capsules What is this medicine? CEPHALEXIN (sef a LEX in) is a cephalosporin antibiotic. It is used to treat certain kinds of bacterial infections It will not work for colds, flu, or other viral infections. This medicine may be used for other purposes; ask your health care provider or pharmacist if you have questions. What should I tell  my health care provider before I take this medicine? They need to know if you have any of these conditions: -kidney disease -stomach or intestine problems, especially colitis -an unusual or allergic reaction to cephalexin, other cephalosporins, penicillins, other antibiotics, medicines, foods, dyes or preservatives -pregnant or trying to get pregnant -breast-feeding How should I use this medicine? Take this medicine by mouth with a full glass of water. Follow the directions on the  prescription label. This medicine can be taken with or without food. Take your medicine at regular intervals. Do not take your medicine more often than directed. Take all of your medicine as directed even if you think you are better. Do not skip doses or stop your medicine early. Talk to your pediatrician regarding the use of this medicine in children. While this drug may be prescribed for selected conditions, precautions do apply. Overdosage: If you think you have taken too much of this medicine contact a poison control center or emergency room at once. NOTE: This medicine is only for you. Do not share this medicine with others. What if I miss a dose? If you miss a dose, take it as soon as you can. If it is almost time for your next dose, take only that dose. Do not take double or extra doses. There should be at least 4 to 6 hours between doses. What may interact with this medicine? -probenecid -some other antibiotics This list may not describe all possible interactions. Give your health care provider a list of all the medicines, herbs, non-prescription drugs, or dietary supplements you use. Also tell them if you smoke, drink alcohol, or use illegal drugs. Some items may interact with your medicine. What should I watch for while using this medicine? Tell your doctor or health care professional if your symptoms do not begin to improve in a few days. Do not treat diarrhea with over the counter products. Contact your doctor if you have diarrhea that lasts more than 2 days or if it is severe and watery. If you have diabetes, you may get a false-positive result for sugar in your urine. Check with your doctor or health care professional. What side effects may I notice from receiving this medicine? Side effects that you should report to your doctor or health care professional as soon as possible: -allergic reactions like skin rash, itching or hives, swelling of the face, lips, or tongue -breathing  problems -pain or trouble passing urine -redness, blistering, peeling or loosening of the skin, including inside the mouth -severe or watery diarrhea -unusually weak or tired -yellowing of the eyes, skin Side effects that usually do not require medical attention (report to your doctor or health care professional if they continue or are bothersome): -gas or heartburn -genital or anal irritation -headache -joint or muscle pain -nausea, vomiting This list may not describe all possible side effects. Call your doctor for medical advice about side effects. You may report side effects to FDA at 1-800-FDA-1088. Where should I keep my medicine? Keep out of the reach of children. Store at room temperature between 59 and 86 degrees F (15 and 30 degrees C). Throw away any unused medicine after the expiration date. NOTE: This sheet is a summary. It may not cover all possible information. If you have questions about this medicine, talk to your doctor, pharmacist, or health care provider.  2012, [employer] Standard. (01/16/2008 5:09:13 PM)  Metoclopramide tablets What is this medicine? METOCLOPRAMIDE (met oh kloe PRA mide) is used to treat the symptoms  of gastroesophageal reflux disease (GERD) like heartburn. It is also used to treat people with slow emptying of the stomach and intestinal tract. This medicine may be used for other purposes; ask your health care provider or pharmacist if you have questions. What should I tell my health care provider before I take this medicine? They need to know if you have any of these conditions: -breast cancer -depression -diabetes -heart failure -high blood pressure -kidney disease -liver disease -Parkinson's disease or a movement disorder -pheochromocytoma -seizures -stomach obstruction, bleeding, or perforation -an unusual or allergic reaction to metoclopramide, procainamide, sulfites, other medicines, foods, dyes, or preservatives -pregnant or trying  to get pregnant -breast-feeding How should I use this medicine? Take this medicine by mouth with a glass of water. Follow the directions on the prescription label. Take this medicine on an empty stomach, about 30 minutes before eating. Take your doses at regular intervals. Do not take your medicine more often than directed. Do not stop taking except on the advice of your doctor or health care professional. A special MedGuide will be given to you by the pharmacist with each prescription and refill. Be sure to read this information carefully each time. Talk to your pediatrician regarding the use of this medicine in children. Special care may be needed. Overdosage: If you think you have taken too much of this medicine contact a poison control center or emergency room at once. NOTE: This medicine is only for you. Do not share this medicine with others. What if I miss a dose? If you miss a dose, take it as soon as you can. If it is almost time for your next dose, take only that dose. Do not take double or extra doses. What may interact with this medicine? -acetaminophen -cyclosporine -digoxin -medicines for blood pressure -medicines for diabetes, including insulin -medicines for hay fever and other allergies -medicines for depression, especially an Monoamine Oxidase Inhibitor (MAOI) -medicines for Parkinson's disease, like levodopa -medicines for sleep or for pain -tetracycline This list may not describe all possible interactions. Give your health care provider a list of all the medicines, herbs, non-prescription drugs, or dietary supplements you use. Also tell them if you smoke, drink alcohol, or use illegal drugs. Some items may interact with your medicine. What should I watch for while using this medicine? It may take a few weeks for your stomach condition to start to get better. However, do not take this medicine for longer than 12 weeks. The longer you take this medicine, and the more you take  it, the greater your chances are of developing serious side effects. If you are an elderly patient, a female patient, or you have diabetes, you may be at an increased risk for side effects from this medicine. Contact your doctor immediately if you start having movements you cannot control such as lip smacking, rapid movements of the tongue, involuntary or uncontrollable movements of the eyes, head, arms and legs, or muscle twitches and spasms. Patients and their families should watch out for worsening depression or thoughts of suicide. Also watch out for any sudden or severe changes in feelings such as feeling anxious, agitated, panicky, irritable, hostile, aggressive, impulsive, severely restless, overly excited and hyperactive, or not being able to sleep. If this happens, especially at the beginning of treatment or after a change in dose, call your doctor. Do not treat yourself for high fever. Ask your doctor or health care professional for advice. You may get drowsy or dizzy. Do not drive,  use machinery, or do anything that needs mental alertness until you know how this drug affects you. Do not stand or sit up quickly, especially if you are an older patient. This reduces the risk of dizzy or fainting spells. Alcohol can make you more drowsy and dizzy. Avoid alcoholic drinks. What side effects may I notice from receiving this medicine? Side effects that you should report to your doctor or health care professional as soon as possible: -allergic reactions like skin rash, itching or hives, swelling of the face, lips, or tongue -abnormal production of milk in females -breast enlargement in both males and females -change in the way you walk -difficulty moving, speaking or swallowing -drooling, lip smacking, or rapid movements of the tongue -excessive sweating -fever -involuntary or uncontrollable movements of the eyes, head, arms and legs -irregular heartbeat or palpitations -muscle twitches and  spasms -unusually weak or tired Side effects that usually do not require medical attention (report to your doctor or health care professional if they continue or are bothersome): -change in sex drive or performance -depressed mood -diarrhea -difficulty sleeping -headache -menstrual changes -restless or nervous This list may not describe all possible side effects. Call your doctor for medical advice about side effects. You may report side effects to FDA at 1-800-FDA-1088. Where should I keep my medicine? Keep out of the reach of children. Store at room temperature between 20 and 25 degrees C (68 and 77 degrees F). Protect from light. Keep container tightly closed. Throw away any unused medicine after the expiration date. NOTE: This sheet is a summary. It may not cover all possible information. If you have questions about this medicine, talk to your doctor, pharmacist, or health care provider.  2012, [employer] Standard. (06/06/2008 4:30:05 PM)  Acetaminophen; Oxycodone tablets What is this medicine? ACETAMINOPHEN; OXYCODONE (a set a MEE noe fen; ox i KOE done) is a pain reliever. It is used to treat mild to moderate pain. This medicine may be used for other purposes; ask your health care provider or pharmacist if you have questions. What should I tell my health care provider before I take this medicine? They need to know if you have any of these conditions: -brain tumor -Crohn's disease, inflammatory bowel disease, or ulcerative colitis -drink more than 3 alcohol containing drinks per day -drug abuse or addiction -head injury -heart or circulation problems -kidney disease or problems going to the bathroom -liver disease -lung disease, asthma, or breathing problems -an unusual or allergic reaction to acetaminophen, oxycodone, other opioid analgesics, other medicines, foods, dyes, or preservatives -pregnant or trying to get pregnant -breast-feeding How should I use this  medicine? Take this medicine by mouth with a full glass of water. Follow the directions on the prescription label. Take your medicine at regular intervals. Do not take your medicine more often than directed. Talk to your pediatrician regarding the use of this medicine in children. Special care may be needed. Patients over 97 years old may have a stronger reaction and need a smaller dose. Overdosage: If you think you have taken too much of this medicine contact a poison control center or emergency room at once. NOTE: This medicine is only for you. Do not share this medicine with others. What if I miss a dose? If you miss a dose, take it as soon as you can. If it is almost time for your next dose, take only that dose. Do not take double or extra doses. What may interact with this medicine? -alcohol or medicines  that contain alcohol -antihistamines -barbiturates like amobarbital, butalbital, butabarbital, methohexital, pentobarbital, phenobarbital, thiopental, and secobarbital -benztropine -drugs for bladder problems like solifenacin, trospium, oxybutynin, tolterodine, hyoscyamine, and methscopolamine -drugs for breathing problems like ipratropium and tiotropium -drugs for certain stomach or intestine problems like propantheline, homatropine methylbromide, glycopyrrolate, atropine, belladonna, and dicyclomine -general anesthetics like etomidate, ketamine, nitrous oxide, propofol, desflurane, enflurane, halothane, isoflurane, and sevoflurane -medicines for depression, anxiety, or psychotic disturbances -medicines for pain like codeine, morphine, pentazocine, buprenorphine, butorphanol, nalbuphine, tramadol, and propoxyphene -medicines for sleep -muscle relaxants -naltrexone -phenothiazines like perphenazine, thioridazine, chlorpromazine, mesoridazine, fluphenazine, prochlorperazine, promazine, and trifluoperazine -scopolamine -trihexyphenidyl This list may not describe all possible interactions.  Give your health care provider a list of all the medicines, herbs, non-prescription drugs, or dietary supplements you use. Also tell them if you smoke, drink alcohol, or use illegal drugs. Some items may interact with your medicine. What should I watch for while using this medicine? Tell your doctor or health care professional if your pain does not go away, if it gets worse, or if you have new or a different type of pain. You may develop tolerance to the medicine. Tolerance means that you will need a higher dose of the medication for pain relief. Tolerance is normal and is expected if you take this medicine for a long time. Do not suddenly stop taking your medicine because you may develop a severe reaction. Your body becomes used to the medicine. This does NOT mean you are addicted. Addiction is a behavior related to getting and using a drug for a nonmedical reason. If you have pain, you have a medical reason to take pain medicine. Your doctor will tell you how much medicine to take. If your doctor wants you to stop the medicine, the dose will be slowly lowered over time to avoid any side effects. You may get drowsy or dizzy. Do not drive, use machinery, or do anything that needs mental alertness until you know how this medicine affects you. Do not stand or sit up quickly, especially if you are an older patient. This reduces the risk of dizzy or fainting spells. Alcohol may interfere with the effect of this medicine. Avoid alcoholic drinks. The medicine will cause constipation. Try to have a bowel movement at least every 2 to 3 days. If you do not have a bowel movement for 3 days, call your doctor or health care professional. Do not take Tylenol (acetaminophen) or medicines that have acetaminophen with this medicine. Too much acetaminophen can be very dangerous. Many nonprescription medicines contain acetaminophen. Always read the labels carefully to avoid taking more acetaminophen. What side effects may I  notice from receiving this medicine? Side effects that you should report to your doctor or health care professional as soon as possible: -allergic reactions like skin rash, itching or hives, swelling of the face, lips, or tongue -breathing difficulties, wheezing -confusion -light headedness or fainting spells -severe stomach pain -yellowing of the skin or the whites of the eyes Side effects that usually do not require medical attention (report to your doctor or health care professional if they continue or are bothersome): -dizziness -drowsiness -nausea -vomiting This list may not describe all possible side effects. Call your doctor for medical advice about side effects. You may report side effects to FDA at 1-800-FDA-1088. Where should I keep my medicine? Keep out of the reach of children. This medicine can be abused. Keep your medicine in a safe place to protect it from theft. Do not share this  medicine with anyone. Selling or giving away this medicine is dangerous and against the law. Store at room temperature between 20 and 25 degrees C (68 and 77 degrees F). Keep container tightly closed. Protect from light. Flush any unused medicines down the toilet. Do not use the medicine after the expiration date. NOTE: This sheet is a summary. It may not cover all possible information. If you have questions about this medicine, talk to your doctor, pharmacist, or health care provider.  2012, [employer] Standard. (09/10/2008 10:01:21 AM)

## 2012-03-17 NOTE — ED Provider Notes (Signed)
History     CSN: [id number]  Arrival date & time 03/17/12  1621   First MD Initiated Contact with Patient 03/17/12 1711      Chief Complaint  Patient presents with  . Flank Pain    (Consider location/radiation/quality/duration/timing/severity/associated sxs/prior treatment) Patient is a 56 y.o. female presenting with flank pain. The history is provided by the patient.  Flank Pain  She had onset yesterday of severe left flank pain to with associated nausea. Denies vomiting. She denies fever, chills, sweats. She has had dysuria and urinary urgency and frequency. She took some acetaminophen for pain with no relief. She rates pain at 10/10. Pain is described as sharp. Nothing makes it better nothing makes it worse.  Past Medical History  Diagnosis Date  . Hypertension   . High cholesterol   . Renal disorder   . Carpal tunnel syndrome     Past Surgical History  Procedure Date  . Knee surgery   . Foot surgery   . Arm surgery   . Abdominal hysterectomy   . Cholecystectomy   . Appendectomy     History reviewed. No pertinent family history.  History  Substance Use Topics  . Smoking status: Never Smoker   . Smokeless tobacco: Not on file  . Alcohol Use: No    OB History    Grav Para Term Preterm Abortions TAB SAB Ect Mult Living                  Review of Systems  Genitourinary: Positive for flank pain.  All other systems reviewed and are negative.    Allergies  Compazine  Home Medications   Current Outpatient Rx  Name Route Sig Dispense Refill  . HYDROCHLOROTHIAZIDE 25 MG PO TABS Oral Take 25 mg by mouth every morning.      Marland Kitchen PRAVASTATIN SODIUM 20 MG PO TABS Oral Take 20 mg by mouth at bedtime.        BP 118/60  Pulse 61  Temp(Src) 98 F (36.7 C) (Oral)  Resp 20  Ht 4\' 11"  (1.499 m)  Wt 130 lb (58.968 kg)  BMI 26.26 kg/m2  SpO2 100%  Physical Exam  Nursing note and vitals reviewed.  56 year old female who is resting comfortably and in no acute  distress. Vital signs are normal. Oxygen saturation is 100% which is normal. Head is normocephalic and atraumatic. PERRLA, EOMI. Oropharynx is clear. Neck is nontender and supple. Back has no midline tenderness but there is moderate left CVA tenderness. Lungs are clear without rales, wheezes, rhonchi. Heart has regular rate and rhythm without murmur. Abdomen is soft, flat, nontender without masses or hepatosplenomegaly. Extremities have full range of motion, no cyanosis or edema. Skin is warm and dry without rash. Neurologic: Mental status is normal, cranial nerves are intact, there no focal motor or sensory deficits.  ED Course  Procedures (including critical care time)  Results for orders placed during the hospital encounter of 03/17/12  URINALYSIS, ROUTINE W REFLEX MICROSCOPIC      Component Value Range   Color, Urine AMBER (*) YELLOW    APPearance CLEAR  CLEAR    Specific Gravity, Urine 1.025  1.005 - 1.030    pH 6.5  5.0 - 8.0    Glucose, UA NEGATIVE  NEGATIVE (mg/dL)   Hgb urine dipstick TRACE (*) NEGATIVE    Bilirubin Urine NEGATIVE  NEGATIVE    Ketones, ur NEGATIVE  NEGATIVE (mg/dL)   Protein, ur NEGATIVE  NEGATIVE (mg/dL)   Urobilinogen,  UA 0.2  0.0 - 1.0 (mg/dL)   Nitrite NEGATIVE  NEGATIVE    Leukocytes, UA TRACE (*) NEGATIVE   CBC      Component Value Range   WBC 7.7  4.0 - 10.5 (K/uL)   RBC 4.41  3.87 - 5.11 (MIL/uL)   Hemoglobin 13.2  12.0 - 15.0 (g/dL)   HCT 16.1  09.6 - 04.5 (%)   MCV 87.5  78.0 - 100.0 (fL)   MCH 29.9  26.0 - 34.0 (pg)   MCHC 34.2  30.0 - 36.0 (g/dL)   RDW 40.9  81.1 - 91.4 (%)   Platelets 175  150 - 400 (K/uL)  DIFFERENTIAL      Component Value Range   Neutrophils Relative 61  43 - 77 (%)   Neutro Abs 4.7  1.7 - 7.7 (K/uL)   Lymphocytes Relative 29  12 - 46 (%)   Lymphs Abs 2.3  0.7 - 4.0 (K/uL)   Monocytes Relative 9  3 - 12 (%)   Monocytes Absolute 0.7  0.1 - 1.0 (K/uL)   Eosinophils Relative 1  0 - 5 (%)   Eosinophils Absolute 0.1  0.0 -  0.7 (K/uL)   Basophils Relative 1  0 - 1 (%)   Basophils Absolute 0.0  0.0 - 0.1 (K/uL)  BASIC METABOLIC PANEL      Component Value Range   Sodium 141  135 - 145 (mEq/L)   Potassium 3.2 (*) 3.5 - 5.1 (mEq/L)   Chloride 103  96 - 112 (mEq/L)   CO2 28  19 - 32 (mEq/L)   Glucose, Bld 95  70 - 99 (mg/dL)   BUN 18  6 - 23 (mg/dL)   Creatinine, Ser 7.82  0.50 - 1.10 (mg/dL)   Calcium 9.7  8.4 - 95.6 (mg/dL)   GFR calc non Af Amer 78 (*) >90 (mL/min)   GFR calc Af Amer >90  >90 (mL/min)  URINE MICROSCOPIC-ADD ON      Component Value Range   Squamous Epithelial / LPF MANY (*) RARE    WBC, UA 11-20  <3 (WBC/hpf)   RBC / HPF 3-6  <3 (RBC/hpf)   Bacteria, UA FEW (*) RARE    Ct Abdomen Pelvis Wo Contrast  03/17/2012  *RADIOLOGY REPORT*  Clinical Data: Left-sided flank pain and burning with urination. History of stones.  CT ABDOMEN AND PELVIS WITHOUT CONTRAST  Technique:  Multidetector CT imaging of the abdomen and pelvis was performed following the standard protocol without intravenous contrast.  Comparison: CT of abdomen and pelvis 03/09/2010.  Findings:  Lung Bases: Minimal dependent atelectasis in the lower lobes of the lungs bilaterally.  Otherwise, unremarkable.  Abdomen/Pelvis:  There are multiple tiny 1-3 mm stones in the collecting systems of the kidneys bilaterally.  No ureteral stones or findings to suggest urinary tract obstruction at this time.  No calculi are identified within the lumen of the urinary bladder.  The patient is status post cholecystectomy.  The unenhanced appearance of the liver, pancreas, spleen and bilateral adrenal glands is unremarkable.  Atherosclerotic calcifications are noted throughout the abdominal and pelvic vasculature, without evidence of focal aneurysm.  There is no ascites or pneumoperitoneum and no pathologic distension of bowel.  No definite pathologic adenopathy identified within the abdomen or pelvis on this noncontrast CT scan.  The patient is status post  total abdominal hysterectomy and bilateral salpingo-oophorectomy.  Musculoskeletal: There are no aggressive appearing lytic or blastic lesions noted in the visualized portions of the skeleton.  IMPRESSION: 1.  Multiple tiny 1-3 mm nonobstructive calculi in the collecting systems of the kidneys bilaterally (left greater than right). 2.  At this time, there are no ureteral calculi or findings to suggest urinary tract obstruction. 3.  Status post cholecystectomy, total abdominal hysterectomy and bilateral salpingo-oophorectomy. 4.  Atherosclerosis.  Original Report Authenticated By: Florencia Reasons, M.D.      1. Flank pain   2. Urinary tract infection       MDM  Flank pain worrisome for ureterolithiasis. Also needs to be evaluated for possible UTI. Old records are reviewed and she did have an ED visit 2 years ago for left flank pain at which time CT scan showed 2 intrarenal calculi on the left.  CT shows intrarenal oculi but no ureteral calculi and no hydronephrosis. Pain is presumably due to urinary tract infection. I have reviewed her record on the [place] controlled substances reporting website and she has no narcotic prescriptions in the last 6 months. She got good pain relief with IV Dilaudid IV Toradol. She will be discharged with prescriptions for Keflex, Reglan, and Percocet. Initial dose of ceftriaxone is given in the emergency department.        Dione Booze, MD 03/17/12 1859

## 2012-03-17 NOTE — ED Notes (Signed)
Received report Agree with previous assessment 

## 2012-03-30 ENCOUNTER — Other Ambulatory Visit (HOSPITAL_COMMUNITY): Payer: Self-pay | Admitting: Physician Assistant

## 2012-03-30 DIAGNOSIS — Z1231 Encounter for screening mammogram for malignant neoplasm of breast: Secondary | ICD-10-CM

## 2012-04-04 ENCOUNTER — Ambulatory Visit (HOSPITAL_COMMUNITY)
Admission: RE | Admit: 2012-04-04 | Discharge: 2012-04-04 | Disposition: A | Discharge status: Home / Self Care | Payer: Self-pay | Source: Ambulatory Visit | Attending: Physician Assistant | Admitting: Physician Assistant

## 2012-04-04 DIAGNOSIS — Z1231 Encounter for screening mammogram for malignant neoplasm of breast: Secondary | ICD-10-CM

## 2012-04-06 ENCOUNTER — Other Ambulatory Visit: Payer: Self-pay | Admitting: Physician Assistant

## 2012-04-06 DIAGNOSIS — R928 Other abnormal and inconclusive findings on diagnostic imaging of breast: Secondary | ICD-10-CM

## 2012-04-27 ENCOUNTER — Ambulatory Visit (HOSPITAL_COMMUNITY)
Admission: RE | Admit: 2012-04-27 | Discharge: 2012-04-27 | Disposition: A | Discharge status: Home / Self Care | Payer: Self-pay | Source: Ambulatory Visit | Attending: Physician Assistant | Admitting: Physician Assistant

## 2012-04-27 ENCOUNTER — Other Ambulatory Visit: Payer: Self-pay | Admitting: Physician Assistant

## 2012-04-27 DIAGNOSIS — R928 Other abnormal and inconclusive findings on diagnostic imaging of breast: Secondary | ICD-10-CM

## 2012-04-27 DIAGNOSIS — N63 Unspecified lump in unspecified breast: Secondary | ICD-10-CM | POA: Insufficient documentation

## 2013-05-09 ENCOUNTER — Other Ambulatory Visit: Payer: Self-pay

## 2013-05-09 DIAGNOSIS — Z1231 Encounter for screening mammogram for malignant neoplasm of breast: Secondary | ICD-10-CM

## 2013-06-15 ENCOUNTER — Other Ambulatory Visit (HOSPITAL_COMMUNITY): Payer: Self-pay | Admitting: Emergency Medicine

## 2013-06-15 DIAGNOSIS — Z1231 Encounter for screening mammogram for malignant neoplasm of breast: Secondary | ICD-10-CM

## 2013-06-19 ENCOUNTER — Institutional Professional Consult (permissible substitution): Payer: Self-pay | Admitting: Medical

## 2013-06-22 ENCOUNTER — Ambulatory Visit (HOSPITAL_COMMUNITY): Admission: RE | Admit: 2013-06-22 | Payer: Self-pay | Source: Ambulatory Visit

## 2013-06-29 ENCOUNTER — Ambulatory Visit (HOSPITAL_COMMUNITY)
Admission: RE | Admit: 2013-06-29 | Discharge: 2013-06-29 | Disposition: A | Discharge status: Home / Self Care | Payer: BC Managed Care – PPO | Source: Ambulatory Visit | Attending: Emergency Medicine | Admitting: Emergency Medicine

## 2013-06-29 DIAGNOSIS — Z1231 Encounter for screening mammogram for malignant neoplasm of breast: Secondary | ICD-10-CM | POA: Insufficient documentation

## 2014-06-07 ENCOUNTER — Other Ambulatory Visit (HOSPITAL_COMMUNITY): Payer: Self-pay | Admitting: Emergency Medicine

## 2014-06-07 DIAGNOSIS — Z1231 Encounter for screening mammogram for malignant neoplasm of breast: Secondary | ICD-10-CM

## 2014-07-03 ENCOUNTER — Ambulatory Visit (HOSPITAL_COMMUNITY)
Admission: RE | Admit: 2014-07-03 | Discharge: 2014-07-03 | Disposition: A | Discharge status: Home / Self Care | Payer: BC Managed Care – PPO | Source: Ambulatory Visit | Attending: Emergency Medicine | Admitting: Emergency Medicine

## 2014-07-03 ENCOUNTER — Other Ambulatory Visit (HOSPITAL_COMMUNITY): Payer: Self-pay | Admitting: Emergency Medicine

## 2014-07-03 DIAGNOSIS — Z1231 Encounter for screening mammogram for malignant neoplasm of breast: Secondary | ICD-10-CM

## 2015-01-07 ENCOUNTER — Encounter (HOSPITAL_COMMUNITY): Payer: Self-pay | Admitting: Emergency Medicine

## 2015-01-07 ENCOUNTER — Emergency Department (HOSPITAL_COMMUNITY)
Admission: EM | Admit: 2015-01-07 | Discharge: 2015-01-07 | Disposition: A | Discharge status: Home / Self Care | Payer: 59 | Attending: Emergency Medicine | Admitting: Emergency Medicine

## 2015-01-07 DIAGNOSIS — N200 Calculus of kidney: Secondary | ICD-10-CM | POA: Insufficient documentation

## 2015-01-07 DIAGNOSIS — I10 Essential (primary) hypertension: Secondary | ICD-10-CM | POA: Diagnosis not present

## 2015-01-07 DIAGNOSIS — E780 Pure hypercholesterolemia: Secondary | ICD-10-CM | POA: Insufficient documentation

## 2015-01-07 DIAGNOSIS — R1084 Generalized abdominal pain: Secondary | ICD-10-CM | POA: Diagnosis not present

## 2015-01-07 DIAGNOSIS — R1030 Lower abdominal pain, unspecified: Secondary | ICD-10-CM | POA: Diagnosis present

## 2015-01-07 DIAGNOSIS — Z8669 Personal history of other diseases of the nervous system and sense organs: Secondary | ICD-10-CM | POA: Insufficient documentation

## 2015-01-07 LAB — CBC WITH DIFFERENTIAL/PLATELET
BASOS PCT: 0 % (ref 0–1)
Basophils Absolute: 0 10*3/uL (ref 0.0–0.1)
EOS ABS: 0.1 10*3/uL (ref 0.0–0.7)
EOS PCT: 1 % (ref 0–5)
HCT: 36.8 % (ref 36.0–46.0)
Hemoglobin: 12.5 g/dL (ref 12.0–15.0)
Lymphocytes Relative: 12 % (ref 12–46)
Lymphs Abs: 1 10*3/uL (ref 0.7–4.0)
MCH: 30 pg (ref 26.0–34.0)
MCHC: 34 g/dL (ref 30.0–36.0)
MCV: 88.5 fL (ref 78.0–100.0)
Monocytes Absolute: 0.7 10*3/uL (ref 0.1–1.0)
Monocytes Relative: 8 % (ref 3–12)
NEUTROS PCT: 79 % — AB (ref 43–77)
Neutro Abs: 6.7 10*3/uL (ref 1.7–7.7)
Platelets: 186 10*3/uL (ref 150–400)
RBC: 4.16 MIL/uL (ref 3.87–5.11)
RDW: 13.8 % (ref 11.5–15.5)
WBC: 8.5 10*3/uL (ref 4.0–10.5)

## 2015-01-07 LAB — URINALYSIS, ROUTINE W REFLEX MICROSCOPIC
Glucose, UA: NEGATIVE mg/dL
Hgb urine dipstick: NEGATIVE
KETONES UR: 15 mg/dL — AB
LEUKOCYTES UA: NEGATIVE
Nitrite: NEGATIVE
Protein, ur: NEGATIVE mg/dL
Specific Gravity, Urine: 1.025 (ref 1.005–1.030)
UROBILINOGEN UA: 1 mg/dL (ref 0.0–1.0)
pH: 6 (ref 5.0–8.0)

## 2015-01-07 LAB — COMPREHENSIVE METABOLIC PANEL
ALBUMIN: 3.3 g/dL — AB (ref 3.5–5.2)
ALT: 24 U/L (ref 0–35)
AST: 23 U/L (ref 0–37)
Alkaline Phosphatase: 92 U/L (ref 39–117)
Anion gap: 8 (ref 5–15)
BUN: 17 mg/dL (ref 6–23)
CHLORIDE: 103 mmol/L (ref 96–112)
CO2: 26 mmol/L (ref 19–32)
Calcium: 9.1 mg/dL (ref 8.4–10.5)
Creatinine, Ser: 1 mg/dL (ref 0.50–1.10)
GFR calc Af Amer: 71 mL/min — ABNORMAL LOW (ref >90)
GFR, EST NON AFRICAN AMERICAN: 61 mL/min — AB (ref >90)
Glucose, Bld: 96 mg/dL (ref 70–99)
Potassium: 4 mmol/L (ref 3.5–5.1)
Sodium: 137 mmol/L (ref 135–145)
Total Bilirubin: 0.4 mg/dL (ref 0.3–1.2)
Total Protein: 6.7 g/dL (ref 6.0–8.3)

## 2015-01-07 LAB — POC URINE PREG, ED: Preg Test, Ur: NEGATIVE

## 2015-01-07 LAB — I-STAT CG4 LACTIC ACID, ED: Lactic Acid, Venous: 0.92 mmol/L (ref 0.5–2.0)

## 2015-01-07 MED ORDER — SODIUM CHLORIDE 0.9 % IV BOLUS (SEPSIS)
1000.0000 mL | Freq: Once | INTRAVENOUS | Status: AC
  Administered 2015-01-07: 1000 mL via INTRAVENOUS

## 2015-01-07 MED ORDER — KETOROLAC TROMETHAMINE 30 MG/ML IJ SOLN
30.0000 mg | Freq: Once | INTRAMUSCULAR | Status: AC
  Administered 2015-01-07: 30 mg via INTRAVENOUS
  Filled 2015-01-07: qty 1

## 2015-01-07 MED ORDER — ONDANSETRON 4 MG PO TBDP: 4.0000 mg | ORAL_TABLET | Freq: Three times a day (TID) | ORAL | Status: DC | PRN

## 2015-01-07 MED ORDER — ONDANSETRON HCL 4 MG/2ML IJ SOLN
4.0000 mg | Freq: Once | INTRAMUSCULAR | Status: AC
  Administered 2015-01-07: 4 mg via INTRAVENOUS
  Filled 2015-01-07: qty 2

## 2015-01-07 NOTE — ED Notes (Signed)
Pt sts seen at [facility] and had xray showing kidney stone; pt c/o continued pain and nausea; pt sts unable to take her pain meds during the day because she is driving

## 2015-01-07 NOTE — ED Provider Notes (Signed)
TIME SEEN: 1:11 PM  CHIEF COMPLAINT: Flank pain, abdominal pain, nausea  HPI: Pt is a 59 y.o. female with history of hypertension, hyperlipidemia who presents to the emergency department with flank pain and lower abdominal cramping. She's had nausea and vomiting but no diarrhea. States she was seen at [facility] urgent care last week and had an outpatient abdominal x-ray which showed kidney stones. She was treated for pyelonephritis. Currently on Bactrim. Reports that she is now having cramping lower abdominal pain and nausea and vomiting. No vaginal bleeding or discharge. No dysuria or hematuria. Status post hysterectomy, appendectomy, cholecystectomy.  ROS: See HPI Constitutional: no fever  Eyes: no drainage  ENT: no runny nose   Cardiovascular:  no chest pain  Resp: no SOB  GI:  vomiting GU: no dysuria Integumentary: no rash  Allergy: no hives  Musculoskeletal: no leg swelling  Neurological: no slurred speech ROS otherwise negative  PAST MEDICAL HISTORY/PAST SURGICAL HISTORY:  Past Medical History  Diagnosis Date  . Hypertension   . High cholesterol   . Renal disorder   . Carpal tunnel syndrome     MEDICATIONS:  Prior to Admission medications   Medication Sig Start Date End Date Taking? Authorizing Provider  hydrochlorothiazide 25 MG tablet Take 25 mg by mouth every morning.      Historical Provider, MD  metoCLOPramide (REGLAN) 10 MG tablet Take 1 tablet (10 mg total) by mouth every 6 (six) hours as needed (nausea). 04/01/36 1/0/62  Abigale Fuel, MD  pravastatin (PRAVACHOL) 20 MG tablet Take 20 mg by mouth at bedtime.      Historical Provider, MD    ALLERGIES:  Allergies  Allergen Reactions  . Compazine Other (See Comments)    Severe numbness to face and legs    SOCIAL HISTORY:  History  Substance Use Topics  . Smoking status: Never Smoker   . Smokeless tobacco: Not on file  . Alcohol Use: No    FAMILY HISTORY: History reviewed. No pertinent family  history.  EXAM: BP 116/69 mmHg  Pulse 69  Temp(Src) 98.1 F (36.7 C) (Oral)  Resp 18  Ht 4\' 11"  (1.499 m)  Wt 152 lb 1 oz (68.975 kg)  BMI 30.70 kg/m2  SpO2 100% CONSTITUTIONAL: Alert and oriented and responds appropriately to questions. Well-appearing; well-nourished HEAD: Normocephalic EYES: Conjunctivae clear, PERRL ENT: normal nose; no rhinorrhea; moist mucous membranes; pharynx without lesions noted NECK: Supple, no meningismus, no LAD  CARD: RRR; S1 and S2 appreciated; no murmurs, no clicks, no rubs, no gallops RESP: Normal chest excursion without splinting or tachypnea; breath sounds clear and equal bilaterally; no wheezes, no rhonchi, no rales,  ABD/GI: Normal bowel sounds; non-distended; soft, non-tender, no rebound, no guarding, abdominal exam is completely benign, no peritoneal signs BACK:  The back appears normal and is non-tender to palpation, there is no CVA tenderness EXT: Normal ROM in all joints; non-tender to palpation; no edema; normal capillary refill; no cyanosis    SKIN: Normal color for age and race; warm NEURO: Moves all extremities equally PSYCH: The patient's mood and manner are appropriate. Grooming and personal hygiene are appropriate.  MEDICAL DECISION MAKING: Patient with complaints of abdominal pain, nausea vomiting. She is afebrile, nontoxic appearing, in no distress. Initial blood pressure was normal but repeat blood pressure is 90/43. We'll give IV hydration. We'll give Toradol and Zofran for nausea and vomiting. Will obtain labs. Urine shows no sign of infection. We'll attempt to obtain outside records for her recent abdominal x-ray.  ED PROGRESS: Patient's labs are unremarkable. Lactate normal. Urine shows no sign of infection or hematuria. Blood pressure is 114/49. Patient reports feeling better and states she is ready for discharge home. She has pain medication to take at home including Indocin and Vicodin. We'll discharge with perception for Zofran  for vomiting. We'll give urology follow-up information given she had a KUB at an urgent care there reportedly showed a kidney stone. Results from this image was not sent and with her outside hospital records.     Gray, DO 01/07/15 1536

## 2015-01-07 NOTE — Discharge Instructions (Signed)
Abdominal Pain, Women °Abdominal (stomach, pelvic, or belly) pain can be caused by many things. It is important to tell your doctor: °· The location of the pain. °· Does it come and go or is it present all the time? °· Are there things that start the pain (eating certain foods, exercise)? °· Are there other symptoms associated with the pain (fever, nausea, vomiting, diarrhea)? °All of this is helpful to know when trying to find the cause of the pain. °CAUSES  °· Stomach: virus or bacteria infection, or ulcer. °· Intestine: appendicitis (inflamed appendix), regional ileitis (Crohn's disease), ulcerative colitis (inflamed colon), irritable bowel syndrome, diverticulitis (inflamed diverticulum of the colon), or cancer of the stomach or intestine. °· Gallbladder disease or stones in the gallbladder. °· Kidney disease, kidney stones, or infection. °· Pancreas infection or cancer. °· Fibromyalgia (pain disorder). °· Diseases of the female organs: °· Uterus: fibroid (non-cancerous) tumors or infection. °· Fallopian tubes: infection or tubal pregnancy. °· Ovary: cysts or tumors. °· Pelvic adhesions (scar tissue). °· Endometriosis (uterus lining tissue growing in the pelvis and on the pelvic organs). °· Pelvic congestion syndrome (female organs filling up with blood just before the menstrual period). °· Pain with the menstrual period. °· Pain with ovulation (producing an egg). °· Pain with an IUD (intrauterine device, birth control) in the uterus. °· Cancer of the female organs. °· Functional pain (pain not caused by a disease, may improve without treatment). °· Psychological pain. °· Depression. °DIAGNOSIS  °Your doctor will decide the seriousness of your pain by doing an examination. °· Blood tests. °· X-rays. °· Ultrasound. °· CT scan (computed tomography, special type of X-ray). °· MRI (magnetic resonance imaging). °· Cultures, for infection. °· Barium enema (dye inserted in the large intestine, to better view it with  X-rays). °· Colonoscopy (looking in intestine with a lighted tube). °· Laparoscopy (minor surgery, looking in abdomen with a lighted tube). °· Major abdominal exploratory surgery (looking in abdomen with a large incision). °TREATMENT  °The treatment will depend on the cause of the pain.  °· Many cases can be observed and treated at home. °· Over-the-counter medicines recommended by your caregiver. °· Prescription medicine. °· Antibiotics, for infection. °· Birth control pills, for painful periods or for ovulation pain. °· Hormone treatment, for endometriosis. °· Nerve blocking injections. °· Physical therapy. °· Antidepressants. °· Counseling with a psychologist or psychiatrist. °· Minor or major surgery. °HOME CARE INSTRUCTIONS  °· Do not take laxatives, unless directed by your caregiver. °· Take over-the-counter pain medicine only if ordered by your caregiver. Do not take aspirin because it can cause an upset stomach or bleeding. °· Try a clear liquid diet (broth or water) as ordered by your caregiver. Slowly move to a bland diet, as tolerated, if the pain is related to the stomach or intestine. °· Have a thermometer and take your temperature several times a day, and record it. °· Bed rest and sleep, if it helps the pain. °· Avoid sexual intercourse, if it causes pain. °· Avoid stressful situations. °· Keep your follow-up appointments and tests, as your caregiver orders. °· If the pain does not go away with medicine or surgery, you may try: °· Acupuncture. °· Relaxation exercises (yoga, meditation). °· Group therapy. °· Counseling. °SEEK MEDICAL CARE IF:  °· You notice certain foods cause stomach pain. °· Your home care treatment is not helping your pain. °· You need stronger pain medicine. °· You want your IUD removed. °· You feel faint or   lightheaded.  You develop nausea and vomiting.  You develop a rash.  You are having side effects or an allergy to your medicine. SEEK IMMEDIATE MEDICAL CARE IF:   Your  pain does not go away or gets worse.  You have a fever.  Your pain is felt only in portions of the abdomen. The right side could possibly be appendicitis. The left lower portion of the abdomen could be colitis or diverticulitis.  You are passing blood in your stools (bright red or black tarry stools, with or without vomiting).  You have blood in your urine.  You develop chills, with or without a fever.  You pass out. MAKE SURE YOU:   Understand these instructions.  Will watch your condition.  Will get help right away if you are not doing well or get worse. Document Released: 08/09/2007 Document Revised: 02/26/2014 Document Reviewed: 08/29/2009 [facility] Patient Information 2015 [place], [place]. This information is not intended to replace advice given to you by your health care provider. Make sure you discuss any questions you have with your health care provider.    Kidney Stones Kidney stones (urolithiasis) are deposits that form inside your kidneys. The intense pain is caused by the stone moving through the urinary tract. When the stone moves, the ureter goes into spasm around the stone. The stone is usually passed in the urine.  CAUSES   A disorder that makes certain neck glands produce too much parathyroid hormone (primary hyperparathyroidism).  A buildup of uric acid crystals, similar to gout in your joints.  Narrowing (stricture) of the ureter.  A kidney obstruction present at birth (congenital obstruction).  Previous surgery on the kidney or ureters.  Numerous kidney infections. SYMPTOMS   Feeling sick to your stomach (nauseous).  Throwing up (vomiting).  Blood in the urine (hematuria).  Pain that usually spreads (radiates) to the groin.  Frequency or urgency of urination. DIAGNOSIS   Taking a history and physical exam.  Blood or urine tests.  CT scan.  Occasionally, an examination of the inside of the urinary bladder (cystoscopy) is  performed. TREATMENT   Observation.  Increasing your fluid intake.  Extracorporeal shock wave lithotripsy--This is a noninvasive procedure that uses shock waves to break up kidney stones.  Surgery may be needed if you have severe pain or persistent obstruction. There are various surgical procedures. Most of the procedures are performed with the use of small instruments. Only small incisions are needed to accommodate these instruments, so recovery time is minimized. The size, location, and chemical composition are all important variables that will determine the proper choice of action for you. Talk to your health care provider to better understand your situation so that you will minimize the risk of injury to yourself and your kidney.  HOME CARE INSTRUCTIONS   Drink enough water and fluids to keep your urine clear or pale yellow. This will help you to pass the stone or stone fragments.  Strain all urine through the provided strainer. Keep all particulate matter and stones for your health care provider to see. The stone causing the pain may be as small as a grain of salt. It is very important to use the strainer each and every time you pass your urine. The collection of your stone will allow your health care provider to analyze it and verify that a stone has actually passed. The stone analysis will often identify what you can do to reduce the incidence of recurrences.  Only take over-the-counter or prescription medicines  for pain, discomfort, or fever as directed by your health care provider.  Make a follow-up appointment with your health care provider as directed.  Get follow-up X-rays if required. The absence of pain does not always mean that the stone has passed. It may have only stopped moving. If the urine remains completely obstructed, it can cause loss of kidney function or even complete destruction of the kidney. It is your responsibility to make sure X-rays and follow-ups are completed.  Ultrasounds of the kidney can show blockages and the status of the kidney. Ultrasounds are not associated with any radiation and can be performed easily in a matter of minutes. SEEK MEDICAL CARE IF:  You experience pain that is progressive and unresponsive to any pain medicine you have been prescribed. SEEK IMMEDIATE MEDICAL CARE IF:   Pain cannot be controlled with the prescribed medicine.  You have a fever or shaking chills.  The severity or intensity of pain increases over 18 hours and is not relieved by pain medicine.  You develop a new onset of abdominal pain.  You feel faint or pass out.  You are unable to urinate. MAKE SURE YOU:   Understand these instructions.  Will watch your condition.  Will get help right away if you are not doing well or get worse. Document Released: 10/12/2005 Document Revised: 06/14/2013 Document Reviewed: 03/15/2013 [facility] Patient Information 2015 [place], [place]. This information is not intended to replace advice given to you by your health care provider. Make sure you discuss any questions you have with your health care provider.

## 2015-01-09 ENCOUNTER — Emergency Department (HOSPITAL_COMMUNITY)
Admission: EM | Admit: 2015-01-09 | Discharge: 2015-01-09 | Disposition: A | Discharge status: Home / Self Care | Payer: 59 | Attending: Emergency Medicine | Admitting: Emergency Medicine

## 2015-01-09 ENCOUNTER — Emergency Department (HOSPITAL_COMMUNITY): Payer: 59

## 2015-01-09 ENCOUNTER — Encounter (HOSPITAL_COMMUNITY): Payer: Self-pay | Admitting: Emergency Medicine

## 2015-01-09 DIAGNOSIS — Z8669 Personal history of other diseases of the nervous system and sense organs: Secondary | ICD-10-CM | POA: Diagnosis not present

## 2015-01-09 DIAGNOSIS — I10 Essential (primary) hypertension: Secondary | ICD-10-CM | POA: Insufficient documentation

## 2015-01-09 DIAGNOSIS — R1084 Generalized abdominal pain: Secondary | ICD-10-CM | POA: Diagnosis present

## 2015-01-09 DIAGNOSIS — Z9049 Acquired absence of other specified parts of digestive tract: Secondary | ICD-10-CM | POA: Insufficient documentation

## 2015-01-09 DIAGNOSIS — R0602 Shortness of breath: Secondary | ICD-10-CM | POA: Insufficient documentation

## 2015-01-09 DIAGNOSIS — Z90710 Acquired absence of both cervix and uterus: Secondary | ICD-10-CM | POA: Diagnosis not present

## 2015-01-09 DIAGNOSIS — R5383 Other fatigue: Secondary | ICD-10-CM | POA: Insufficient documentation

## 2015-01-09 DIAGNOSIS — N23 Unspecified renal colic: Secondary | ICD-10-CM | POA: Diagnosis not present

## 2015-01-09 DIAGNOSIS — Z79899 Other long term (current) drug therapy: Secondary | ICD-10-CM | POA: Diagnosis not present

## 2015-01-09 DIAGNOSIS — R197 Diarrhea, unspecified: Secondary | ICD-10-CM | POA: Insufficient documentation

## 2015-01-09 DIAGNOSIS — R1030 Lower abdominal pain, unspecified: Secondary | ICD-10-CM

## 2015-01-09 DIAGNOSIS — Z8639 Personal history of other endocrine, nutritional and metabolic disease: Secondary | ICD-10-CM | POA: Insufficient documentation

## 2015-01-09 LAB — BASIC METABOLIC PANEL
Anion gap: 6 (ref 5–15)
BUN: 12 mg/dL (ref 6–23)
CALCIUM: 9.1 mg/dL (ref 8.4–10.5)
CHLORIDE: 106 mmol/L (ref 96–112)
CO2: 24 mmol/L (ref 19–32)
CREATININE: 0.93 mg/dL (ref 0.50–1.10)
GFR calc Af Amer: 78 mL/min — ABNORMAL LOW (ref >90)
GFR calc non Af Amer: 67 mL/min — ABNORMAL LOW (ref >90)
Glucose, Bld: 123 mg/dL — ABNORMAL HIGH (ref 70–99)
POTASSIUM: 3.5 mmol/L (ref 3.5–5.1)
Sodium: 136 mmol/L (ref 135–145)

## 2015-01-09 LAB — CBC
HEMATOCRIT: 38.9 % (ref 36.0–46.0)
Hemoglobin: 13 g/dL (ref 12.0–15.0)
MCH: 30 pg (ref 26.0–34.0)
MCHC: 33.4 g/dL (ref 30.0–36.0)
MCV: 89.8 fL (ref 78.0–100.0)
PLATELETS: 209 10*3/uL (ref 150–400)
RBC: 4.33 MIL/uL (ref 3.87–5.11)
RDW: 13.7 % (ref 11.5–15.5)
WBC: 8.2 10*3/uL (ref 4.0–10.5)

## 2015-01-09 LAB — I-STAT TROPONIN, ED: TROPONIN I, POC: 0 ng/mL (ref 0.00–0.08)

## 2015-01-09 LAB — URINALYSIS, ROUTINE W REFLEX MICROSCOPIC
BILIRUBIN URINE: NEGATIVE
Glucose, UA: NEGATIVE mg/dL
Hgb urine dipstick: NEGATIVE
KETONES UR: NEGATIVE mg/dL
Leukocytes, UA: NEGATIVE
Nitrite: NEGATIVE
PH: 7.5 (ref 5.0–8.0)
Protein, ur: NEGATIVE mg/dL
Specific Gravity, Urine: 1.008 (ref 1.005–1.030)
Urobilinogen, UA: 0.2 mg/dL (ref 0.0–1.0)

## 2015-01-09 LAB — BRAIN NATRIURETIC PEPTIDE: B Natriuretic Peptide: 88.5 pg/mL (ref 0.0–100.0)

## 2015-01-09 MED ORDER — OXYCODONE-ACETAMINOPHEN 5-325 MG PO TABS: 1.0000 | ORAL_TABLET | ORAL | Status: DC | PRN

## 2015-01-09 MED ORDER — TAMSULOSIN HCL 0.4 MG PO CAPS: 0.4000 mg | ORAL_CAPSULE | Freq: Every day | ORAL | Status: DC

## 2015-01-09 MED ORDER — ONDANSETRON HCL 4 MG/2ML IJ SOLN
4.0000 mg | Freq: Once | INTRAMUSCULAR | Status: AC
  Administered 2015-01-09: 4 mg via INTRAVENOUS
  Filled 2015-01-09: qty 2

## 2015-01-09 MED ORDER — IOHEXOL 300 MG/ML  SOLN
100.0000 mL | Freq: Once | INTRAMUSCULAR | Status: AC | PRN
  Administered 2015-01-09: 100 mL via INTRAVENOUS

## 2015-01-09 MED ORDER — HYDROMORPHONE HCL 1 MG/ML IJ SOLN
1.0000 mg | Freq: Once | INTRAMUSCULAR | Status: AC
  Administered 2015-01-09: 1 mg via INTRAVENOUS
  Filled 2015-01-09: qty 1

## 2015-01-09 MED ORDER — SODIUM CHLORIDE 0.9 % IV BOLUS (SEPSIS)
1000.0000 mL | Freq: Once | INTRAVENOUS | Status: AC
  Administered 2015-01-09: 1000 mL via INTRAVENOUS

## 2015-01-09 MED ORDER — IOHEXOL 300 MG/ML  SOLN
25.0000 mL | Freq: Once | INTRAMUSCULAR | Status: AC | PRN
  Administered 2015-01-09: 25 mL via ORAL

## 2015-01-09 NOTE — ED Provider Notes (Signed)
CSN: [id number]     Arrival date & time 01/09/15  3267 History   First MD Initiated Contact with Patient 01/09/15 [phone]     Chief Complaint  Patient presents with  . Shortness of Breath  . Abdominal Pain     (Consider location/radiation/quality/duration/timing/severity/associated sxs/prior Treatment) HPI Comments: Patient presents to the ER for evaluation of abdominal pain, nausea, vomiting, diarrhea and shortness of breath. Patient reports that she has diffuse crampy abdominal pain. She has not had a fever. She reports that she has not been able to eat or drink because of the symptoms. She was recently treated for kidney stone and pyelonephritis. She reports that the abdominal pain and diarrhea started yesterday. Shortness of breath present this morning.  Patient is a 59 y.o. female presenting with shortness of breath and abdominal pain.  Shortness of Breath Associated symptoms: abdominal pain   Abdominal Pain Associated symptoms: diarrhea, fatigue, nausea and shortness of breath     Past Medical History  Diagnosis Date  . Hypertension   . High cholesterol   . Renal disorder   . Carpal tunnel syndrome    Past Surgical History  Procedure Laterality Date  . Knee surgery    . Foot surgery    . Arm surgery    . Abdominal hysterectomy    . Cholecystectomy    . Appendectomy     No family history on file. History  Substance Use Topics  . Smoking status: Never Smoker   . Smokeless tobacco: Not on file  . Alcohol Use: No   OB History    No data available     Review of Systems  Constitutional: Positive for fatigue.  Respiratory: Positive for shortness of breath.   Gastrointestinal: Positive for nausea, abdominal pain and diarrhea.  All other systems reviewed and are negative.     Allergies  Compazine  Home Medications   Prior to Admission medications   Medication Sig Start Date End Date Taking? Authorizing Provider  hydrochlorothiazide 25 MG tablet Take 25 mg by  mouth every morning.     Yes Historical Provider, MD  ondansetron (ZOFRAN ODT) 4 MG disintegrating tablet Take 1 tablet (4 mg total) by mouth every 8 (eight) hours as needed for nausea or vomiting. 01/07/15  Yes Kristen N Ward, DO  oxyCODONE-acetaminophen (PERCOCET) 5-325 MG per tablet Take 1-2 tablets by mouth every 4 (four) hours as needed. 01/09/15   Orpah Greek, MD  tamsulosin (FLOMAX) 0.4 MG CAPS capsule Take 1 capsule (0.4 mg total) by mouth daily. 01/09/15   Orpah Greek, MD   BP 104/45 mmHg  Pulse 60  Temp(Src) 97.8 F (36.6 C) (Oral)  Resp 18  SpO2 97% Physical Exam  Constitutional: She is oriented to person, place, and time. She appears well-developed and well-nourished. No distress.  HENT:  Head: Normocephalic and atraumatic.  Right Ear: Hearing normal.  Left Ear: Hearing normal.  Nose: Nose normal.  Mouth/Throat: Oropharynx is clear and moist and mucous membranes are normal.  Eyes: Conjunctivae and EOM are normal. Pupils are equal, round, and reactive to light.  Neck: Normal range of motion. Neck supple.  Cardiovascular: Regular rhythm, S1 normal and S2 normal.  Exam reveals no gallop and no friction rub.   No murmur heard. Pulmonary/Chest: Effort normal and breath sounds normal. No respiratory distress. She exhibits no tenderness.  Abdominal: Soft. Normal appearance and bowel sounds are normal. There is no hepatosplenomegaly. There is generalized tenderness. There is no rebound, no guarding, no  tenderness at McBurney's point and negative Murphy's sign. No hernia.    Musculoskeletal: Normal range of motion.  Neurological: She is alert and oriented to person, place, and time. She has normal strength. No cranial nerve deficit or sensory deficit. Coordination normal. GCS eye subscore is 4. GCS verbal subscore is 5. GCS motor subscore is 6.  Skin: Skin is warm, dry and intact. No rash noted. No cyanosis.  Psychiatric: She has a normal mood and affect. Her speech  is normal and behavior is normal. Thought content normal.  Nursing note and vitals reviewed.   ED Course  Procedures (including critical care time) Labs Review Labs Reviewed  BASIC METABOLIC PANEL - Abnormal; Notable for the following:    Glucose, Bld 123 (*)    GFR calc non Af Amer 67 (*)    GFR calc Af Amer 78 (*)    All other components within normal limits  CLOSTRIDIUM DIFFICILE BY PCR  STOOL CULTURE  CBC  BRAIN NATRIURETIC PEPTIDE  URINALYSIS, ROUTINE W REFLEX MICROSCOPIC  I-STAT TROPOININ, ED    Imaging Review Ct Abdomen Pelvis W Contrast  01/09/2015   CLINICAL DATA:  Abdominal pain, lower the patient also as epigastric pain. She complains of dry heaving and diarrhea.  EXAM: CT ABDOMEN AND PELVIS WITH CONTRAST  TECHNIQUE: Multidetector CT imaging of the abdomen and pelvis was performed using the standard protocol following bolus administration of intravenous contrast.  CONTRAST:  141mL OMNIPAQUE IOHEXOL 300 MG/ML  SOLN  COMPARISON:  CT abdomen and pelvis 03/17/2012  FINDINGS: Mild dependent atelectasis is evident. The heart size is normal. No significant pleural or pericardial effusion is present.  The liver and spleen are within normal limits. The stomach, duodenum, and pancreas are unremarkable. The common bile duct is within normal limits following cholecystectomy. The adrenal glands are normal bilaterally.  A 3 mm nonobstructing stone at the upper pole of the left kidney is stable. A punctate stone at the upper pole of the left kidney is again seen. No definite right-sided stones are evident. Sensitivity for small stones is decreased with IV contrast. A 3.5 mm partially obstructing right ureteral stone is present at the pelvic inlet. There is mild dilation of the ureter proximal to this without hydronephrosis. The distal ureter is within normal limits. The left ureter is unremarkable. Urinary bladder is unremarkable.  The rectosigmoid colon is mostly collapsed. Remainder the colon is  unremarkable. The patient is status post appendectomy. The small bowel is within normal limits. The patient is status post hysterectomy and bilateral salpingo oophorectomy. No significant adenopathy or free fluid is present. Atherosclerotic calcifications are again noted within the abdominal aorta and branch vessels without aneurysm.  The bone windows are within normal limits.  IMPRESSION: 1. Partially obstructing 3.5 mm stone within the right ureter at the level of the pelvic inlet. 2. No other significant right-sided nephrolithiasis. While the ureter is mildly dilated, there is no significant hydronephrosis. 3. Nonobstructing stones at the upper pole of the left kidney are stable. 4. Atherosclerosis. 5. Status post appendectomy, cholecystectomy, and hysterectomy.   Electronically Signed   By: San Morelle M.D.   On: 01/09/2015 10:00   Dg Chest Portable 1 View  01/09/2015   CLINICAL DATA:  Dyspnea and nausea for 4 days.  EXAM: PORTABLE CHEST - 1 VIEW  COMPARISON:  09/20/2010  FINDINGS: A single AP portable view of the chest demonstrates no focal airspace consolidation or alveolar edema. The lungs are grossly clear. There is no large effusion  or pneumothorax. Cardiac and mediastinal contours appear unremarkable.  IMPRESSION: No active disease.   Electronically Signed   By: Andreas Newport M.D.   On: 01/09/2015 06:32     EKG Interpretation   Date/Time:  Wednesday January 09 2015 05:48:00 EDT Ventricular Rate:  69 PR Interval:  174 QRS Duration: 76 QT Interval:  394 QTC Calculation: 422 R Axis:   62 Text Interpretation:  Normal sinus rhythm Nonspecific ST abnormality  Abnormal ECG Confirmed by OTTER  MD, OLGA ([id number]) on 01/09/2015 6:47:59 AM      MDM   Final diagnoses:  Abdominal pain, lower  Renal colic on right side    Patient presents to the ER for evaluation of right-sided pain. Patient reports that she was recently told she had a kidney stone on x-ray was performed. She had  intermittent episodes of right-sided pain and was treated for possible pyelonephritis. She has now had onset of recurrent right-sided pain now in the right lower abdomen. Pain is severe. Her blood work and urinalysis were unremarkable. No sign of infection. No hematuria. CT scan was performed to further evaluate. This does show mid ureter stone is a cause of her pain. There is only partial obstruction. Patient administered analgesia. She will follow-up with urology, return if symptoms worsen.    Orpah Greek, MD 01/09/15 1438

## 2015-01-09 NOTE — ED Notes (Signed)
Pt left for CT.

## 2015-01-09 NOTE — ED Notes (Signed)
Patient with shortness of breath, abdominal pain and diarrhea that started yesterday.  Patient is short of breath on exertion.  Patient denies any chest pain at this time.

## 2015-01-09 NOTE — ED Notes (Signed)
Pt c/o increased shortness of breath since yesterday, worsening on exertion. Pt also c/o epigastric pain associated with dry heaving and diarrhea. Unable to maintain liquids at home.

## 2015-01-09 NOTE — ED Notes (Signed)
Pt back from CT

## 2015-01-09 NOTE — ED Notes (Signed)
NAD at this time. Pt is stable and going home.  

## 2015-06-10 ENCOUNTER — Other Ambulatory Visit (HOSPITAL_COMMUNITY): Payer: Self-pay | Admitting: *Deleted

## 2015-06-10 DIAGNOSIS — Z1231 Encounter for screening mammogram for malignant neoplasm of breast: Secondary | ICD-10-CM

## 2015-07-05 ENCOUNTER — Ambulatory Visit (HOSPITAL_COMMUNITY)
Admission: RE | Admit: 2015-07-05 | Discharge: 2015-07-05 | Disposition: A | Discharge status: Home / Self Care | Payer: 59 | Source: Ambulatory Visit | Attending: *Deleted | Admitting: *Deleted

## 2015-07-05 DIAGNOSIS — Z1231 Encounter for screening mammogram for malignant neoplasm of breast: Secondary | ICD-10-CM | POA: Insufficient documentation

## 2015-07-10 ENCOUNTER — Ambulatory Visit (HOSPITAL_COMMUNITY): Payer: 59

## 2015-10-27 HISTORY — PX: BREAST EXCISIONAL BIOPSY: SUR124

## 2015-11-29 ENCOUNTER — Emergency Department (HOSPITAL_COMMUNITY)
Admission: EM | Admit: 2015-11-29 | Discharge: 2015-11-29 | Disposition: A | Discharge status: Home / Self Care | Payer: BLUE CROSS/BLUE SHIELD | Source: Home / Self Care | Attending: Family Medicine | Admitting: Family Medicine

## 2015-11-29 ENCOUNTER — Encounter (HOSPITAL_COMMUNITY): Payer: Self-pay | Admitting: Emergency Medicine

## 2015-11-29 DIAGNOSIS — S39012A Strain of muscle, fascia and tendon of lower back, initial encounter: Secondary | ICD-10-CM | POA: Diagnosis not present

## 2015-11-29 DIAGNOSIS — T148 Other injury of unspecified body region: Secondary | ICD-10-CM | POA: Diagnosis not present

## 2015-11-29 DIAGNOSIS — R51 Headache: Secondary | ICD-10-CM | POA: Diagnosis not present

## 2015-11-29 DIAGNOSIS — R519 Headache, unspecified: Secondary | ICD-10-CM

## 2015-11-29 DIAGNOSIS — T148XXA Other injury of unspecified body region, initial encounter: Secondary | ICD-10-CM

## 2015-11-29 MED ORDER — ONDANSETRON 4 MG PO TBDP
ORAL_TABLET | ORAL | Status: AC
  Filled 2015-11-29: qty 1

## 2015-11-29 MED ORDER — DIPHENHYDRAMINE HCL 50 MG/ML IJ SOLN
50.0000 mg | Freq: Once | INTRAMUSCULAR | Status: AC
  Administered 2015-11-29: 50 mg via INTRAMUSCULAR

## 2015-11-29 MED ORDER — KETOROLAC TROMETHAMINE 30 MG/ML IJ SOLN
30.0000 mg | Freq: Once | INTRAMUSCULAR | Status: AC
  Administered 2015-11-29: 30 mg via INTRAMUSCULAR

## 2015-11-29 MED ORDER — TRAMADOL HCL 50 MG PO TABS: 50.0000 mg | ORAL_TABLET | Freq: Four times a day (QID) | ORAL | Status: DC | PRN

## 2015-11-29 MED ORDER — ONDANSETRON 4 MG PO TBDP
4.0000 mg | ORAL_TABLET | Freq: Once | ORAL | Status: AC
  Administered 2015-11-29: 4 mg via ORAL

## 2015-11-29 MED ORDER — DEXAMETHASONE SODIUM PHOSPHATE 10 MG/ML IJ SOLN
INTRAMUSCULAR | Status: AC
  Filled 2015-11-29: qty 1

## 2015-11-29 MED ORDER — KETOROLAC TROMETHAMINE 30 MG/ML IJ SOLN
INTRAMUSCULAR | Status: AC
  Filled 2015-11-29: qty 1

## 2015-11-29 MED ORDER — NAPROXEN 375 MG PO TABS: 375.0000 mg | ORAL_TABLET | Freq: Two times a day (BID) | ORAL | Status: DC

## 2015-11-29 MED ORDER — ONDANSETRON HCL 4 MG PO TABS: 4.0000 mg | ORAL_TABLET | Freq: Four times a day (QID) | ORAL | Status: DC

## 2015-11-29 MED ORDER — DEXAMETHASONE SODIUM PHOSPHATE 10 MG/ML IJ SOLN
10.0000 mg | Freq: Once | INTRAMUSCULAR | Status: AC
  Administered 2015-11-29: 10 mg via INTRAMUSCULAR

## 2015-11-29 MED ORDER — DIPHENHYDRAMINE HCL 50 MG/ML IJ SOLN
INTRAMUSCULAR | Status: AC
  Filled 2015-11-29: qty 1

## 2015-11-29 NOTE — Discharge Instructions (Signed)
Back Injury Prevention °Back injuries can be very painful. They can also be difficult to heal. After having one back injury, you are more likely to injure your back again. It is important to learn how to avoid injuring or re-injuring your back. The following tips can help you to prevent a back injury. °WHAT SHOULD I KNOW ABOUT PHYSICAL FITNESS? °· Exercise for 30 minutes per day on most days of the week or as directed by your health care provider. Make sure to: °· Do aerobic exercises, such as walking, jogging, biking, or swimming. °· Do exercises that increase balance and strength, such as tai chi and yoga. These can decrease your risk of falling and injuring your back. °· Do stretching exercises to help with flexibility. °· Try to develop strong abdominal muscles. Your abdominal muscles provide a lot of the support that is needed by your back. °· Maintain a healthy weight.  This helps to decrease your risk of a back injury. °WHAT SHOULD I KNOW ABOUT MY DIET? °· Talk with your health care provider about your overall diet. Take supplements and vitamins only as directed by your health care provider. °· Talk with your health care provider about how much calcium and vitamin D you need each day. These nutrients help to prevent weakening of the bones (osteoporosis). Osteoporosis can cause broken (fractured) bones, which lead to back pain. °· Include good sources of calcium in your diet, such as dairy products, green leafy vegetables, and products that have had calcium added to them (fortified). °· Include good sources of vitamin D in your diet, such as milk and foods that are fortified with vitamin D. °WHAT SHOULD I KNOW ABOUT MY POSTURE? °· Sit up straight and stand up straight. Avoid leaning forward when you sit or hunching over when you stand. °· Choose chairs that have good low-back (lumbar) support. °· If you work at a desk, sit close to it so you do not need to lean over. Keep your chin tucked in. Keep your neck  drawn back, and keep your elbows bent at a right angle. Your arms should look like the letter "L." °· Sit high and close to the steering wheel when you drive. Add a lumbar support to your car seat, if needed. °· Avoid sitting or standing in one position for very long. Take breaks to get up, stretch, and walk around at least one time every hour. Take breaks every hour if you are driving for long periods of time. °· Sleep on your side with your knees slightly bent, or sleep on your back with a pillow under your knees. Do not lie on the front of your body to sleep. °WHAT SHOULD I KNOW ABOUT LIFTING, TWISTING, AND REACHING? °Lifting and Heavy Lifting °· Avoid heavy lifting, especially repetitive heavy lifting. If you must do heavy lifting: °· Stretch before lifting. °· Work slowly. °· Rest between lifts. °· Use a tool such as a cart or a dolly to move objects if one is available. °· Make several small trips instead of carrying one heavy load. °· Ask for help when you need it, especially when moving big objects. °· Follow these steps when lifting: °· Stand with your feet shoulder-width apart. °· Get as close to the object as you can. Do not try to pick up a heavy object that is far from your body. °· Use handles or lifting straps if they are available. °· Bend at your knees. Squat down, but keep your heels off the floor. °·   Keep your shoulders pulled back, your chin tucked in, and your back straight.  Lift the object slowly while you tighten the muscles in your legs, abdomen, and buttocks. Keep the object as close to the center of your body as possible.  Follow these steps when putting down a heavy load:  Stand with your feet shoulder-width apart.  Lower the object slowly while you tighten the muscles in your legs, abdomen, and buttocks. Keep the object as close to the center of your body as possible.  Keep your shoulders pulled back, your chin tucked in, and your back straight.  Bend at your knees. Squat  down, but keep your heels off the floor.  Use handles or lifting straps if they are available. Twisting and Reaching  Avoid lifting heavy objects above your waist.  Do not twist at your waist while you are lifting or carrying a load. If you need to turn, move your feet.  Do not bend over without bending at your knees.  Avoid reaching over your head, across a table, or for an object on a high surface. WHAT ARE SOME OTHER TIPS?  Avoid wet floors and icy ground. Keep sidewalks clear of ice to prevent falls.  Do not sleep on a mattress that is too soft or too hard.  Keep items that are used frequently within easy reach.  Put heavier objects on shelves at waist level, and put lighter objects on lower or higher shelves.  Find ways to decrease your stress, such as exercise, massage, or relaxation techniques. Stress can build up in your muscles. Tense muscles are more vulnerable to injury.  Talk with your health care provider if you feel anxious or depressed. These conditions can make back pain worse.  Wear flat heel shoes with cushioned soles.  Avoid sudden movements.  Use both shoulder straps when carrying a backpack.  Do not use any tobacco products, including cigarettes, chewing tobacco, or electronic cigarettes. If you need help quitting, ask your health care provider.   This information is not intended to replace advice given to you by your health care provider. Make sure you discuss any questions you have with your health care provider.   Document Released: 11/19/2004 Document Revised: 02/26/2015 Document Reviewed: 10/16/2014 Elsevier Interactive Patient Education 2016 [employer].  Migraine Headache A migraine headache is very bad, throbbing pain on one or both sides of your head. Talk to your doctor about what things may bring on (trigger) your migraine headaches. HOME CARE  Only take medicines as told by your doctor.  Lie down in a dark, quiet room when you have a  migraine.  Keep a journal to find out if certain things bring on migraine headaches. For example, write down:  What you eat and drink.  How much sleep you get.  Any change to your diet or medicines.  Lessen how much alcohol you drink.  Quit smoking if you smoke.  Get enough sleep.  Lessen any stress in your life.  Keep lights dim if bright lights bother you or make your migraines worse. GET HELP RIGHT AWAY IF:   Your migraine becomes really bad.  You have a fever.  You have a stiff neck.  You have trouble seeing.  Your muscles are weak, or you lose muscle control.  You lose your balance or have trouble walking.  You feel like you will pass out (faint), or you pass out.  You have really bad symptoms that are different than your first symptoms. MAKE  SURE YOU:   Understand these instructions.  Will watch your condition.  Will get help right away if you are not doing well or get worse.   This information is not intended to replace advice given to you by your health care provider. Make sure you discuss any questions you have with your health care provider.   Document Released: 07/21/2008 Document Revised: 01/04/2012 Document Reviewed: 06/19/2013 Elsevier Interactive Patient Education 2016 [employer]  Lumbosacral Strain Lumbosacral strain is a strain of any of the parts that make up your lumbosacral vertebrae. Your lumbosacral vertebrae are the bones that make up the lower third of your backbone. Your lumbosacral vertebrae are held together by muscles and tough, fibrous tissue (ligaments).  CAUSES  A sudden blow to your back can cause lumbosacral strain. Also, anything that causes an excessive stretch of the muscles in the low back can cause this strain. This is typically seen when people exert themselves strenuously, fall, lift heavy objects, bend, or crouch repeatedly. RISK FACTORS  Physically demanding work.  Participation in pushing or pulling sports or  sports that require a sudden twist of the back (tennis, golf, baseball).  Weight lifting.  Excessive lower back curvature.  Forward-tilted pelvis.  Weak back or abdominal muscles or both.  Tight hamstrings. SIGNS AND SYMPTOMS  Lumbosacral strain may cause pain in the area of your injury or pain that moves (radiates) down your leg.  DIAGNOSIS Your health care provider can often diagnose lumbosacral strain through a physical exam. In some cases, you may need tests such as X-ray exams.  TREATMENT  Treatment for your lower back injury depends on many factors that your clinician will have to evaluate. However, most treatment will include the use of anti-inflammatory medicines. HOME CARE INSTRUCTIONS   Avoid hard physical activities (tennis, racquetball, waterskiing) if you are not in proper physical condition for it. This may aggravate or create problems.  If you have a back problem, avoid sports requiring sudden body movements. Swimming and walking are generally safer activities.  Maintain good posture.  Maintain a healthy weight.  For acute conditions, you may put ice on the injured area.  Put ice in a plastic bag.  Place a towel between your skin and the bag.  Leave the ice on for 20 minutes, 2-3 times a day.  When the low back starts healing, stretching and strengthening exercises may be recommended. SEEK MEDICAL CARE IF:  Your back pain is getting worse.  You experience severe back pain not relieved with medicines. SEEK IMMEDIATE MEDICAL CARE IF:   You have numbness, tingling, weakness, or problems with the use of your arms or legs.  There is a change in bowel or bladder control.  You have increasing pain in any area of the body, including your belly (abdomen).  You notice shortness of breath, dizziness, or feel faint.  You feel sick to your stomach (nauseous), are throwing up (vomiting), or become sweaty.  You notice discoloration of your toes or legs, or your  feet get very cold. MAKE SURE YOU:   Understand these instructions.  Will watch your condition.  Will get help right away if you are not doing well or get worse.   This information is not intended to replace advice given to you by your health care provider. Make sure you discuss any questions you have with your health care provider.   Document Released: 07/22/2005 Document Revised: 11/02/2014 Document Reviewed: 05/31/2013 Elsevier Interactive Patient Education 2016 [employer].  Muscle Strain  A muscle strain is an injury that occurs when a muscle is stretched beyond its normal length. Usually a small number of muscle fibers are torn when this happens. Muscle strain is rated in degrees. First-degree strains have the least amount of muscle fiber tearing and pain. Second-degree and third-degree strains have increasingly more tearing and pain.  Usually, recovery from muscle strain takes 1-2 weeks. Complete healing takes 5-6 weeks.  CAUSES  Muscle strain happens when a sudden, violent force placed on a muscle stretches it too far. This may occur with lifting, sports, or a fall.  RISK FACTORS Muscle strain is especially common in athletes.  SIGNS AND SYMPTOMS At the site of the muscle strain, there may be:  Pain.  Bruising.  Swelling.  Difficulty using the muscle due to pain or lack of normal function. DIAGNOSIS  Your health care provider will perform a physical exam and ask about your medical history. TREATMENT  Often, the best treatment for a muscle strain is resting, icing, and applying cold compresses to the injured area.  HOME CARE INSTRUCTIONS   Use the PRICE method of treatment to promote muscle healing during the first 2-3 days after your injury. The PRICE method involves:  Protecting the muscle from being injured again.  Restricting your activity and resting the injured body part.  Icing your injury. To do this, put ice in a plastic bag. Place a towel between your  skin and the bag. Then, apply the ice and leave it on from 15-20 minutes each hour. After the third day, switch to moist heat packs.  Apply compression to the injured area with a splint or elastic bandage. Be careful not to wrap it too tightly. This may interfere with blood circulation or increase swelling.  Elevate the injured body part above the level of your heart as often as you can.  Only take over-the-counter or prescription medicines for pain, discomfort, or fever as directed by your health care provider.  Warming up prior to exercise helps to prevent future muscle strains. SEEK MEDICAL CARE IF:   You have increasing pain or swelling in the injured area.  You have numbness, tingling, or a significant loss of strength in the injured area. MAKE SURE YOU:   Understand these instructions.  Will watch your condition.  Will get help right away if you are not doing well or get worse.   This information is not intended to replace advice given to you by your health care provider. Make sure you discuss any questions you have with your health care provider.   Document Released: 10/12/2005 Document Revised: 08/02/2013 Document Reviewed: 05/11/2013 Elsevier Interactive Patient Education [employer].

## 2015-11-29 NOTE — ED Provider Notes (Signed)
CSN: [id number]     Arrival date & time 11/29/15  1757 History   First MD Initiated Contact with Patient 11/29/15 1947     Chief Complaint  Patient presents with  . Back Pain   (Consider location/radiation/quality/duration/timing/severity/associated sxs/prior Treatment) HPI Comments: 60 year old morbidly obese female is complaining of low back pain for 4 days. Achy, sharp to dull. Constant, waxes and wanes. 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 The pain is located across the lower lumbosacral musculature and is worse with movement, bending, stooping, pulling etc. Denies radicular pain. She is also complaining of a headache which started yesterday. The headache is located in the frontal area of the cranium as well as behind her eyes. Constant. Nothing relieves pain. She also has photophobia and mild nausea. She has had one migraine headache in her life. She does not have frequent headaches. Denies problems with vision, speech, hearing, swallowing, focal paresthesias or motor weakness. Denies problems with memory, confusion or orientation.   Past Medical History  Diagnosis Date  . Hypertension   . High cholesterol   . Renal disorder   . Carpal tunnel syndrome    Past Surgical History  Procedure Laterality Date  . Knee surgery    . Foot surgery    . Arm surgery    . Abdominal hysterectomy    . Cholecystectomy    . Appendectomy     No family history on file. Social History  Substance Use Topics  . Smoking status: Never Smoker   . Smokeless tobacco: None  . Alcohol Use: No   OB History    No data available     Review of Systems  Constitutional: Positive for activity change. Negative for fever and fatigue.  Eyes: Positive for photophobia. Negative for pain and redness.  Respiratory: Negative for cough, shortness of breath and wheezing.   Cardiovascular: Negative for chest pain, palpitations and  leg swelling.  Gastrointestinal: Positive for nausea. Negative for vomiting, abdominal pain, diarrhea, abdominal distention and anal bleeding.  Genitourinary: Negative.   Musculoskeletal: Positive for myalgias and back pain. Negative for joint swelling, neck pain and neck stiffness.  Skin: Negative.   Neurological: Positive for headaches. Negative for dizziness, tremors, seizures, syncope, facial asymmetry, speech difficulty, weakness, light-headedness and numbness.  Psychiatric/Behavioral: Negative.   All other systems reviewed and are negative.   Allergies  Compazine  Home Medications   Prior to Admission medications   Medication Sig Start Date End Date Taking? Authorizing Provider  hydrochlorothiazide 25 MG tablet Take 25 mg by mouth every morning.      Historical Provider, MD  naproxen (NAPROSYN) 375 MG tablet Take 1 tablet (375 mg total) by mouth 2 (two) times daily. 11/29/15   Janne Napoleon, NP  ondansetron (ZOFRAN) 4 MG tablet Take 1 tablet (4 mg total) by mouth every 6 (six) hours. 11/29/15   Janne Napoleon, NP  tamsulosin (FLOMAX) 0.4 MG CAPS capsule Take 1 capsule (0.4 mg total) by mouth daily. 01/09/15   Orpah Greek, MD  traMADol (ULTRAM) 50 MG tablet Take 1 tablet (50 mg total) by mouth every 6 (six) hours as needed. 11/29/15   Janne Napoleon, NP   Meds Ordered and Administered this Visit   Medications  ketorolac (TORADOL) 30 MG/ML injection 30 mg (not administered)  dexamethasone (DECADRON) injection 10 mg (not administered)  diphenhydrAMINE (BENADRYL) injection 50 mg (not administered)  ondansetron (  ZOFRAN-ODT) disintegrating tablet 4 mg (not administered)    BP 133/78 mmHg  Pulse 66  Temp(Src) 97.7 F (36.5 C) (Oral)  SpO2 98% No data found.   Physical Exam  Constitutional: She is oriented to person, place, and time. She appears well-developed and well-nourished. No distress.  HENT:  Head: Normocephalic and atraumatic.  Mouth/Throat: Oropharynx is clear and moist.  No oropharyngeal exudate.  Oropharynx clear and moist. Supple rises symmetrically. Tongue and uvula midline. Swallowing reflex is normal.  Eyes: Conjunctivae and EOM are normal. Pupils are equal, round, and reactive to light. Right eye exhibits no discharge. Left eye exhibits no discharge.  Neck: Normal range of motion. Neck supple. No tracheal deviation present.  No cervical spine tenderness. There is tenderness to the bilateral trapezius muscles.  Cardiovascular: Normal rate, regular rhythm and intact distal pulses.   Pulmonary/Chest: Effort normal and breath sounds normal. No respiratory distress.  Abdominal: Soft. There is no tenderness.  Musculoskeletal: Normal range of motion. She exhibits no edema.  Tenderness across the lower para lumbosacral musculature. No spinal tenderness, swelling or deformity.  Lymphadenopathy:    She has no cervical adenopathy.  Neurological: She is alert and oriented to person, place, and time. She has normal strength. She displays no tremor. No cranial nerve deficit or sensory deficit. She exhibits normal muscle tone. Coordination normal.  Skin: Skin is warm and dry.  Psychiatric: She has a normal mood and affect.  Nursing note and vitals reviewed.   ED Course  Procedures (including critical care time)  Labs Review Labs Reviewed - No data to display  Imaging Review No results found.   Visual Acuity Review  Right Eye Distance:   Left Eye Distance:   Bilateral Distance:    Right Eye Near:   Left Eye Near:    Bilateral Near:         MDM   1. Mixed headache   2. Low back strain, initial encounter   3. Muscle strain    Meds ordered this encounter  Medications  . ketorolac (TORADOL) 30 MG/ML injection 30 mg    Sig:   . dexamethasone (DECADRON) injection 10 mg    Sig:   . diphenhydrAMINE (BENADRYL) injection 50 mg    Sig:   . ondansetron (ZOFRAN-ODT) disintegrating tablet 4 mg    Sig:   . naproxen (NAPROSYN) 375 MG tablet    Sig:  Take 1 tablet (375 mg total) by mouth 2 (two) times daily.    Dispense:  20 tablet    Refill:  0    Order Specific Question:  Supervising Provider    Answer:  Billy Fischer [phone]  . traMADol (ULTRAM) 50 MG tablet    Sig: Take 1 tablet (50 mg total) by mouth every 6 (six) hours as needed.    Dispense:  15 tablet    Refill:  0    Order Specific Question:  Supervising Provider    Answer:  Billy Fischer [phone]  . ondansetron (ZOFRAN) 4 MG tablet    Sig: Take 1 tablet (4 mg total) by mouth every 6 (six) hours.    Dispense:  12 tablet    Refill:  0    Order Specific Question:  Supervising Provider    Answer:  Billy Fischer [phone]   See your dotor next week. To ED  If worse new sx's problems    Janne Napoleon, NP 11/29/15 2013

## 2015-11-29 NOTE — ED Notes (Signed)
Low back pain and bad headache.  Patient reports onset Monday 1/30.  No known injury.  No pain in either leg.  No numbness.

## 2016-02-27 ENCOUNTER — Ambulatory Visit (HOSPITAL_COMMUNITY)
Admission: EM | Admit: 2016-02-27 | Discharge: 2016-02-27 | Disposition: A | Discharge status: Home / Self Care | Payer: BLUE CROSS/BLUE SHIELD | Attending: Family Medicine | Admitting: Family Medicine

## 2016-02-27 ENCOUNTER — Encounter (HOSPITAL_COMMUNITY): Payer: Self-pay | Admitting: *Deleted

## 2016-02-27 DIAGNOSIS — S39012A Strain of muscle, fascia and tendon of lower back, initial encounter: Secondary | ICD-10-CM | POA: Diagnosis not present

## 2016-02-27 MED ORDER — IBUPROFEN 800 MG PO TABS: 800.0000 mg | ORAL_TABLET | Freq: Three times a day (TID) | ORAL | Status: DC

## 2016-02-27 MED ORDER — CYCLOBENZAPRINE HCL 5 MG PO TABS: 5.0000 mg | ORAL_TABLET | Freq: Three times a day (TID) | ORAL | Status: DC | PRN

## 2016-02-27 NOTE — ED Provider Notes (Signed)
CSN: [id number]     Arrival date & time 02/27/16  1741 History   First MD Initiated Contact with Patient 02/27/16 1823     Chief Complaint  Patient presents with  . Back Pain   (Consider location/radiation/quality/duration/timing/severity/associated sxs/prior Treatment) Patient is a 60 y.o. female presenting with back pain. The history is provided by the patient.  Back Pain Location:  Lumbar spine Quality:  Stiffness and stabbing Radiates to:  Does not radiate Pain severity:  Mild Onset quality:  Gradual Duration:  3 days Progression:  Unchanged Chronicity:  New Context: lifting heavy objects   Relieved by:  None tried Worsened by:  Nothing tried Associated symptoms: no abdominal pain, no chest pain, no dysuria, no leg pain, no numbness, no paresthesias, no pelvic pain and no weakness     Past Medical History  Diagnosis Date  . Hypertension   . High cholesterol   . Renal disorder   . Carpal tunnel syndrome    Past Surgical History  Procedure Laterality Date  . Knee surgery    . Foot surgery    . Arm surgery    . Abdominal hysterectomy    . Cholecystectomy    . Appendectomy     History reviewed. No pertinent family history. Social History  Substance Use Topics  . Smoking status: Never Smoker   . Smokeless tobacco: None  . Alcohol Use: No   OB History    No data available     Review of Systems  Constitutional: Negative.   Cardiovascular: Negative for chest pain.  Gastrointestinal: Negative.  Negative for abdominal pain.  Genitourinary: Negative.  Negative for dysuria and pelvic pain.  Musculoskeletal: Positive for back pain. Negative for joint swelling and gait problem.  Neurological: Negative.  Negative for weakness, numbness and paresthesias.  All other systems reviewed and are negative.   Allergies  Compazine  Home Medications   Prior to Admission medications   Medication Sig Start Date End Date Taking? Authorizing Provider  cyclobenzaprine  (FLEXERIL) 5 MG tablet Take 1 tablet (5 mg total) by mouth 3 (three) times daily as needed for muscle spasms. 02/27/16   Billy Fischer, MD  hydrochlorothiazide 25 MG tablet Take 25 mg by mouth every morning.      Historical Provider, MD  ibuprofen (ADVIL,MOTRIN) 800 MG tablet Take 1 tablet (800 mg total) by mouth 3 (three) times daily. For back pain 02/27/16   Billy Fischer, MD  naproxen (NAPROSYN) 375 MG tablet Take 1 tablet (375 mg total) by mouth 2 (two) times daily. 11/29/15   Janne Napoleon, NP  ondansetron (ZOFRAN) 4 MG tablet Take 1 tablet (4 mg total) by mouth every 6 (six) hours. 11/29/15   Janne Napoleon, NP  tamsulosin (FLOMAX) 0.4 MG CAPS capsule Take 1 capsule (0.4 mg total) by mouth daily. 01/09/15   Orpah Greek, MD  traMADol (ULTRAM) 50 MG tablet Take 1 tablet (50 mg total) by mouth every 6 (six) hours as needed. 11/29/15   Janne Napoleon, NP   Meds Ordered and Administered this Visit  Medications - No data to display  BP 137/79 mmHg  Pulse 64  Temp(Src) 98.2 F (36.8 C) (Oral)  Resp 16  SpO2 98% No data found.   Physical Exam  Constitutional: She is oriented to person, place, and time. She appears well-developed and well-nourished. No distress.  Abdominal: Soft. Bowel sounds are normal. There is no tenderness.  Musculoskeletal: She exhibits tenderness.       Lumbar back:  She exhibits tenderness, bony tenderness, pain and spasm. She exhibits no deformity and normal pulse.  Neurological: She is alert and oriented to person, place, and time.  Skin: Skin is warm and dry.  Nursing note and vitals reviewed.   ED Course  Procedures (including critical care time)  Labs Review Labs Reviewed - No data to display  Imaging Review No results found.   Visual Acuity Review  Right Eye Distance:   Left Eye Distance:   Bilateral Distance:    Right Eye Near:   Left Eye Near:    Bilateral Near:         MDM   1. Low back strain, initial encounter    Meds ordered this encounter   Medications  . cyclobenzaprine (FLEXERIL) 5 MG tablet    Sig: Take 1 tablet (5 mg total) by mouth 3 (three) times daily as needed for muscle spasms.    Dispense:  30 tablet    Refill:  0  . ibuprofen (ADVIL,MOTRIN) 800 MG tablet    Sig: Take 1 tablet (800 mg total) by mouth 3 (three) times daily. For back pain    Dispense:  30 tablet    Refill:  0       Billy Fischer, MD 02/27/16 [phone]

## 2016-02-27 NOTE — ED Notes (Signed)
Pt  Reports  Low  Back  Pain     X   3  Days      pt  Is  In the  Lower  Middle of  Her  Back    She  denys  Any  Recent  specefic  Injury reports   Lifts  A  Lot  At  Work  -  She  denys   Any  Urinary  Symptoms           she  Is  Able  To sit  Up  In a  Chair

## 2016-05-05 ENCOUNTER — Other Ambulatory Visit: Payer: Self-pay | Admitting: *Deleted

## 2016-05-05 DIAGNOSIS — Z1231 Encounter for screening mammogram for malignant neoplasm of breast: Secondary | ICD-10-CM

## 2016-07-07 ENCOUNTER — Ambulatory Visit
Admission: RE | Admit: 2016-07-07 | Discharge: 2016-07-07 | Disposition: A | Discharge status: Home / Self Care | Payer: BLUE CROSS/BLUE SHIELD | Source: Ambulatory Visit | Attending: *Deleted | Admitting: *Deleted

## 2016-07-07 DIAGNOSIS — Z1231 Encounter for screening mammogram for malignant neoplasm of breast: Secondary | ICD-10-CM

## 2016-07-09 ENCOUNTER — Other Ambulatory Visit: Payer: Self-pay | Admitting: *Deleted

## 2016-07-09 DIAGNOSIS — R928 Other abnormal and inconclusive findings on diagnostic imaging of breast: Secondary | ICD-10-CM

## 2016-07-14 ENCOUNTER — Other Ambulatory Visit: Payer: BLUE CROSS/BLUE SHIELD

## 2016-07-23 ENCOUNTER — Ambulatory Visit
Admission: RE | Admit: 2016-07-23 | Discharge: 2016-07-23 | Disposition: A | Discharge status: Home / Self Care | Payer: BLUE CROSS/BLUE SHIELD | Source: Ambulatory Visit | Attending: *Deleted | Admitting: *Deleted

## 2016-07-23 ENCOUNTER — Other Ambulatory Visit: Payer: Self-pay | Admitting: *Deleted

## 2016-07-23 DIAGNOSIS — R928 Other abnormal and inconclusive findings on diagnostic imaging of breast: Secondary | ICD-10-CM

## 2016-07-28 ENCOUNTER — Other Ambulatory Visit: Payer: Self-pay | Admitting: *Deleted

## 2016-07-28 DIAGNOSIS — R928 Other abnormal and inconclusive findings on diagnostic imaging of breast: Secondary | ICD-10-CM

## 2016-07-29 ENCOUNTER — Ambulatory Visit
Admission: RE | Admit: 2016-07-29 | Discharge: 2016-07-29 | Disposition: A | Discharge status: Home / Self Care | Payer: BLUE CROSS/BLUE SHIELD | Source: Ambulatory Visit | Attending: *Deleted | Admitting: *Deleted

## 2016-07-29 ENCOUNTER — Other Ambulatory Visit: Payer: Self-pay | Admitting: *Deleted

## 2016-07-29 DIAGNOSIS — R928 Other abnormal and inconclusive findings on diagnostic imaging of breast: Secondary | ICD-10-CM

## 2016-08-11 ENCOUNTER — Ambulatory Visit: Payer: Self-pay | Admitting: General Surgery

## 2016-08-11 DIAGNOSIS — N6021 Fibroadenosis of right breast: Secondary | ICD-10-CM

## 2016-08-17 ENCOUNTER — Other Ambulatory Visit: Payer: Self-pay | Admitting: General Surgery

## 2016-08-17 DIAGNOSIS — N6021 Fibroadenosis of right breast: Secondary | ICD-10-CM

## 2016-08-21 NOTE — Pre-Procedure Instructions (Signed)
Sonia Jackson  08/21/2016      [facility] [address], [place] [id number] [place] [place] [address] Phone: [phone] Fax: [phone]    Your procedure is scheduled on Thurs, Nov 2 @ 9:30 AM  Report to [facility] Admitting at 7:30 AM  Call this number if you have problems the morning of surgery:  [phone]   Remember:  Do not eat food or drink liquids after midnight.  Take these medicines the morning of surgery with A SIP OF WATER Zofran(Ondansetron-if needed),Tamsulosin(Flomax)and Tramadol(Ultram-if needed)              Stop taking Naproxen,Ibuprofen,along with any Vitamins or Herbal Medications. No Goody's,BC's,Advil,Motrin,or Fish Oil.    Do not wear jewelry, make-up or nail polish.  Do not wear lotions, powders,perfumes, or deoderant.  Do not shave 48 hours prior to surgery.   Do not bring valuables to the hospital.  [facility] is not responsible for any belongings or valuables.  Contacts, dentures or bridgework may not be worn into surgery.  Leave your suitcase in the car.  After surgery it may be brought to your room.  For patients admitted to the hospital, discharge time will be determined by your treatment team.  Patients discharged the day of surgery will not be allowed to drive home.    [facility]  Before surgery, you can play an important role.  Because skin is not sterile, your skin needs to be as free of germs as possible.  You can reduce the number of germs on you skin by washing with CHG (chlorahexidine gluconate) soap before surgery.  CHG is an antiseptic cleaner which kills germs and bonds with the skin to continue killing germs even after washing.  Please DO NOT use if you have an allergy to CHG or antibacterial soaps.  If your skin becomes reddened/irritated stop using the CHG and inform your nurse when you arrive at Short Stay.  Do not shave  (including legs and underarms) for at least 48 hours prior to the first CHG shower.  You may shave your face.  Please follow these instructions carefully:   1.  Shower with CHG Soap the night before surgery and the                                morning of Surgery.  2.  If you choose to wash your hair, wash your hair first as usual with your       normal shampoo.  3.  After you shampoo, rinse your hair and body thoroughly to remove the                      Shampoo.  4.  Use CHG as you would any other liquid soap.  You can apply chg directly       to the skin and wash gently with scrungie or a clean washcloth.  5.  Apply the CHG Soap to your body ONLY FROM THE NECK DOWN.        Do not use on open wounds or open sores.  Avoid contact with your eyes,       ears, mouth and genitals (private parts).  Wash genitals (private parts)       with your normal soap.  6.  Wash thoroughly, paying special attention to the area where  your surgery        will be performed.  7.  Thoroughly rinse your body with warm water from the neck down.  8.  DO NOT shower/wash with your normal soap after using and rinsing off       the CHG Soap.  9.  Pat yourself dry with a clean towel.            10.  Wear clean pajamas.            11.  Place clean sheets on your bed the night of your first shower and do not        sleep with pets.  Day of Surgery  Do not apply any lotions/deoderants the morning of surgery.  Please wear clean clothes to the hospital/surgery center.    Please read over the following fact sheets that you were given. Pain Booklet, Coughing and Deep Breathing and Surgical Site Infection Prevention

## 2016-08-24 ENCOUNTER — Encounter (HOSPITAL_COMMUNITY)
Admission: RE | Admit: 2016-08-24 | Discharge: 2016-08-24 | Disposition: A | Discharge status: Home / Self Care | Payer: BLUE CROSS/BLUE SHIELD | Source: Ambulatory Visit | Attending: General Surgery | Admitting: General Surgery

## 2016-08-24 ENCOUNTER — Encounter (HOSPITAL_COMMUNITY): Payer: Self-pay

## 2016-08-24 DIAGNOSIS — Z90710 Acquired absence of both cervix and uterus: Secondary | ICD-10-CM | POA: Diagnosis not present

## 2016-08-24 DIAGNOSIS — N6011 Diffuse cystic mastopathy of right breast: Secondary | ICD-10-CM | POA: Diagnosis not present

## 2016-08-24 DIAGNOSIS — I10 Essential (primary) hypertension: Secondary | ICD-10-CM | POA: Diagnosis not present

## 2016-08-24 DIAGNOSIS — Z888 Allergy status to other drugs, medicaments and biological substances status: Secondary | ICD-10-CM | POA: Diagnosis not present

## 2016-08-24 DIAGNOSIS — N6021 Fibroadenosis of right breast: Secondary | ICD-10-CM | POA: Diagnosis not present

## 2016-08-24 DIAGNOSIS — N62 Hypertrophy of breast: Secondary | ICD-10-CM | POA: Diagnosis not present

## 2016-08-24 HISTORY — DX: Nausea with vomiting, unspecified: R11.2

## 2016-08-24 HISTORY — DX: Unspecified hearing loss, unspecified ear: H91.90

## 2016-08-24 HISTORY — DX: Personal history of urinary calculi: Z87.442

## 2016-08-24 HISTORY — DX: Other specified postprocedural states: Z98.890

## 2016-08-24 LAB — CBC
HEMATOCRIT: 44.3 % (ref 36.0–46.0)
HEMOGLOBIN: 14.7 g/dL (ref 12.0–15.0)
MCH: 30.1 pg (ref 26.0–34.0)
MCHC: 33.2 g/dL (ref 30.0–36.0)
MCV: 90.8 fL (ref 78.0–100.0)
Platelets: 215 10*3/uL (ref 150–400)
RBC: 4.88 MIL/uL (ref 3.87–5.11)
RDW: 14 % (ref 11.5–15.5)
WBC: 7.3 10*3/uL (ref 4.0–10.5)

## 2016-08-24 LAB — BASIC METABOLIC PANEL
ANION GAP: 7 (ref 5–15)
BUN: 12 mg/dL (ref 6–20)
CALCIUM: 9.4 mg/dL (ref 8.9–10.3)
CO2: 27 mmol/L (ref 22–32)
Chloride: 105 mmol/L (ref 101–111)
Creatinine, Ser: 0.7 mg/dL (ref 0.44–1.00)
GFR calc Af Amer: >60 mL/min (ref >60)
GLUCOSE: 102 mg/dL — AB (ref 65–99)
POTASSIUM: 3.7 mmol/L (ref 3.5–5.1)
SODIUM: 139 mmol/L (ref 135–145)

## 2016-08-24 NOTE — Progress Notes (Signed)
PCP-  York Pellant at [facility] Cardiologist - denies  EKG - 08/24/16 CXR - denies  Echo - denies Stress test - 2008 Cardiac Cath - denies  Patient denies chest pain and shortness of breath at PAT appointment.

## 2016-08-26 ENCOUNTER — Ambulatory Visit
Admission: RE | Admit: 2016-08-26 | Discharge: 2016-08-26 | Disposition: A | Discharge status: Home / Self Care | Payer: BLUE CROSS/BLUE SHIELD | Source: Ambulatory Visit | Attending: General Surgery | Admitting: General Surgery

## 2016-08-26 DIAGNOSIS — N6021 Fibroadenosis of right breast: Secondary | ICD-10-CM

## 2016-08-27 ENCOUNTER — Encounter (HOSPITAL_COMMUNITY): Payer: Self-pay | Admitting: General Practice

## 2016-08-27 ENCOUNTER — Ambulatory Visit (HOSPITAL_COMMUNITY)
Admission: RE | Admit: 2016-08-27 | Discharge: 2016-08-27 | Disposition: A | Discharge status: Home / Self Care | Payer: BLUE CROSS/BLUE SHIELD | Source: Ambulatory Visit | Attending: General Surgery | Admitting: General Surgery

## 2016-08-27 ENCOUNTER — Ambulatory Visit
Admission: RE | Admit: 2016-08-27 | Discharge: 2016-08-27 | Disposition: A | Discharge status: Home / Self Care | Payer: BLUE CROSS/BLUE SHIELD | Source: Ambulatory Visit | Attending: General Surgery | Admitting: General Surgery

## 2016-08-27 ENCOUNTER — Ambulatory Visit (HOSPITAL_COMMUNITY): Payer: BLUE CROSS/BLUE SHIELD | Admitting: Anesthesiology

## 2016-08-27 ENCOUNTER — Encounter (HOSPITAL_COMMUNITY)
Admission: RE | Disposition: A | Discharge status: Home / Self Care | Payer: Self-pay | Source: Ambulatory Visit | Attending: General Surgery

## 2016-08-27 DIAGNOSIS — N6021 Fibroadenosis of right breast: Secondary | ICD-10-CM

## 2016-08-27 DIAGNOSIS — I10 Essential (primary) hypertension: Secondary | ICD-10-CM | POA: Insufficient documentation

## 2016-08-27 DIAGNOSIS — Z90710 Acquired absence of both cervix and uterus: Secondary | ICD-10-CM | POA: Insufficient documentation

## 2016-08-27 DIAGNOSIS — Z888 Allergy status to other drugs, medicaments and biological substances status: Secondary | ICD-10-CM | POA: Insufficient documentation

## 2016-08-27 DIAGNOSIS — N6011 Diffuse cystic mastopathy of right breast: Secondary | ICD-10-CM | POA: Insufficient documentation

## 2016-08-27 DIAGNOSIS — N62 Hypertrophy of breast: Secondary | ICD-10-CM | POA: Insufficient documentation

## 2016-08-27 HISTORY — PX: BREAST LUMPECTOMY WITH RADIOACTIVE SEED LOCALIZATION: SHX6424

## 2016-08-27 SURGERY — BREAST LUMPECTOMY WITH RADIOACTIVE SEED LOCALIZATION
Anesthesia: General | Site: Breast | Laterality: Right

## 2016-08-27 MED ORDER — CHLORHEXIDINE GLUCONATE CLOTH 2 % EX PADS: 6.0000 | MEDICATED_PAD | Freq: Once | CUTANEOUS | Status: DC

## 2016-08-27 MED ORDER — MIDAZOLAM HCL 5 MG/5ML IJ SOLN
INTRAMUSCULAR | Status: DC | PRN
  Administered 2016-08-27: 2 mg via INTRAVENOUS

## 2016-08-27 MED ORDER — LIDOCAINE 2% (20 MG/ML) 5 ML SYRINGE
INTRAMUSCULAR | Status: DC | PRN
  Administered 2016-08-27: 80 mg via INTRAVENOUS

## 2016-08-27 MED ORDER — DEXAMETHASONE SODIUM PHOSPHATE 10 MG/ML IJ SOLN
INTRAMUSCULAR | Status: AC
  Filled 2016-08-27: qty 1

## 2016-08-27 MED ORDER — ONDANSETRON HCL 4 MG/2ML IJ SOLN
INTRAMUSCULAR | Status: AC
  Filled 2016-08-27: qty 2

## 2016-08-27 MED ORDER — PROPOFOL 10 MG/ML IV BOLUS
INTRAVENOUS | Status: AC
  Filled 2016-08-27: qty 20

## 2016-08-27 MED ORDER — CEFAZOLIN SODIUM-DEXTROSE 2-4 GM/100ML-% IV SOLN
2.0000 g | INTRAVENOUS | Status: AC
  Administered 2016-08-27: 2 g via INTRAVENOUS
  Filled 2016-08-27: qty 100

## 2016-08-27 MED ORDER — HYDROMORPHONE HCL 1 MG/ML IJ SOLN
0.2500 mg | INTRAMUSCULAR | Status: DC | PRN
  Administered 2016-08-27 (×2): 0.5 mg via INTRAVENOUS

## 2016-08-27 MED ORDER — FENTANYL CITRATE (PF) 100 MCG/2ML IJ SOLN
INTRAMUSCULAR | Status: AC
  Filled 2016-08-27: qty 2

## 2016-08-27 MED ORDER — DEXAMETHASONE SODIUM PHOSPHATE 10 MG/ML IJ SOLN
INTRAMUSCULAR | Status: DC | PRN
  Administered 2016-08-27: 10 mg via INTRAVENOUS

## 2016-08-27 MED ORDER — PROPOFOL 10 MG/ML IV BOLUS
INTRAVENOUS | Status: DC | PRN
  Administered 2016-08-27: 20 mg via INTRAVENOUS
  Administered 2016-08-27: 150 mg via INTRAVENOUS

## 2016-08-27 MED ORDER — FENTANYL CITRATE (PF) 100 MCG/2ML IJ SOLN
INTRAMUSCULAR | Status: DC | PRN
  Administered 2016-08-27: 100 ug via INTRAVENOUS

## 2016-08-27 MED ORDER — MIDAZOLAM HCL 2 MG/2ML IJ SOLN
INTRAMUSCULAR | Status: AC
  Filled 2016-08-27: qty 2

## 2016-08-27 MED ORDER — HYDROCODONE-ACETAMINOPHEN 5-325 MG PO TABS: 1.0000 | ORAL_TABLET | ORAL | 0 refills | Status: DC | PRN

## 2016-08-27 MED ORDER — ONDANSETRON HCL 4 MG/2ML IJ SOLN
INTRAMUSCULAR | Status: DC | PRN
  Administered 2016-08-27: 4 mg via INTRAVENOUS

## 2016-08-27 MED ORDER — HYDROMORPHONE HCL 1 MG/ML IJ SOLN
INTRAMUSCULAR | Status: AC
  Filled 2016-08-27: qty 0.5

## 2016-08-27 MED ORDER — LIDOCAINE 2% (20 MG/ML) 5 ML SYRINGE
INTRAMUSCULAR | Status: AC
  Filled 2016-08-27: qty 5

## 2016-08-27 MED ORDER — 0.9 % SODIUM CHLORIDE (POUR BTL) OPTIME
TOPICAL | Status: DC | PRN
  Administered 2016-08-27: 1000 mL

## 2016-08-27 MED ORDER — LACTATED RINGERS IV SOLN
INTRAVENOUS | Status: DC
  Administered 2016-08-27 (×2): via INTRAVENOUS

## 2016-08-27 MED ORDER — BUPIVACAINE-EPINEPHRINE (PF) 0.5% -1:200000 IJ SOLN
INTRAMUSCULAR | Status: AC
  Filled 2016-08-27: qty 30

## 2016-08-27 MED ORDER — BUPIVACAINE-EPINEPHRINE 0.5% -1:200000 IJ SOLN
INTRAMUSCULAR | Status: DC | PRN
  Administered 2016-08-27: 50 mL

## 2016-08-27 SURGICAL SUPPLY — 40 items
APPLIER CLIP 9.375 MED OPEN (MISCELLANEOUS)
BLADE SURG 15 STRL LF DISP TIS (BLADE) ×1 IMPLANT
BLADE SURG 15 STRL SS (BLADE) ×2
CANISTER SUCTION 2500CC (MISCELLANEOUS) ×3 IMPLANT
CHLORAPREP W/TINT 26ML (MISCELLANEOUS) ×3 IMPLANT
CLIP APPLIE 9.375 MED OPEN (MISCELLANEOUS) IMPLANT
CONT SPEC 4OZ CLIKSEAL STRL BL (MISCELLANEOUS) ×3 IMPLANT
COVER PROBE W GEL 5X96 (DRAPES) ×3 IMPLANT
COVER SURGICAL LIGHT HANDLE (MISCELLANEOUS) ×3 IMPLANT
DERMABOND ADVANCED (GAUZE/BANDAGES/DRESSINGS) ×2
DERMABOND ADVANCED .7 DNX12 (GAUZE/BANDAGES/DRESSINGS) ×1 IMPLANT
DEVICE DUBIN SPECIMEN MAMMOGRA (MISCELLANEOUS) ×3 IMPLANT
DRAPE CHEST BREAST 15X10 FENES (DRAPES) ×3 IMPLANT
DRAPE UTILITY XL STRL (DRAPES) ×3 IMPLANT
ELECT COATED BLADE 2.86 ST (ELECTRODE) ×3 IMPLANT
ELECT REM PT RETURN 9FT ADLT (ELECTROSURGICAL) ×3
ELECTRODE REM PT RTRN 9FT ADLT (ELECTROSURGICAL) ×1 IMPLANT
GLOVE BIO SURGEON STRL SZ7.5 (GLOVE) ×6 IMPLANT
GLOVE INDICATOR 7.0 STRL GRN (GLOVE) ×6 IMPLANT
GLOVE SURG SS PI 7.0 STRL IVOR (GLOVE) ×3 IMPLANT
GOWN STRL REUS W/ TWL LRG LVL3 (GOWN DISPOSABLE) ×2 IMPLANT
GOWN STRL REUS W/TWL LRG LVL3 (GOWN DISPOSABLE) ×4
KIT BASIN OR (CUSTOM PROCEDURE TRAY) ×3 IMPLANT
KIT MARKER MARGIN INK (KITS) ×3 IMPLANT
LIGHT WAVEGUIDE WIDE FLAT (MISCELLANEOUS) ×3 IMPLANT
NEEDLE HYPO 25X1 1.5 SAFETY (NEEDLE) ×3 IMPLANT
NS IRRIG 1000ML POUR BTL (IV SOLUTION) ×3 IMPLANT
PACK SURGICAL SETUP 50X90 (CUSTOM PROCEDURE TRAY) ×3 IMPLANT
PENCIL BUTTON HOLSTER BLD 10FT (ELECTRODE) ×3 IMPLANT
SPONGE LAP 18X18 X RAY DECT (DISPOSABLE) ×3 IMPLANT
SUT MNCRL AB 4-0 PS2 18 (SUTURE) ×3 IMPLANT
SUT SILK 2 0 SH (SUTURE) IMPLANT
SUT VIC AB 3-0 SH 18 (SUTURE) ×3 IMPLANT
SYR BULB 3OZ (MISCELLANEOUS) ×3 IMPLANT
SYR CONTROL 10ML LL (SYRINGE) ×3 IMPLANT
TOWEL OR 17X24 6PK STRL BLUE (TOWEL DISPOSABLE) ×3 IMPLANT
TOWEL OR 17X26 10 PK STRL BLUE (TOWEL DISPOSABLE) IMPLANT
TUBE CONNECTING 12'X1/4 (SUCTIONS) ×1
TUBE CONNECTING 12X1/4 (SUCTIONS) ×2 IMPLANT
YANKAUER SUCT BULB TIP NO VENT (SUCTIONS) ×3 IMPLANT

## 2016-08-27 NOTE — Op Note (Signed)
08/27/2016  9:46 AM  PATIENT:  Sonia Jackson  60 y.o. female  PRE-OPERATIVE DIAGNOSIS:  RIGHT BREAST COMPLEX SCLEROSING LESION  POST-OPERATIVE DIAGNOSIS:  RIGHT BREAST COMPLEX SCLEROSING LESION  PROCEDURE:  Procedure(s): RIGHT BREAST LUMPECTOMY WITH RADIOACTIVE SEED LOCALIZATION (Right)  SURGEON:  Surgeon(s) and Role:    * Jovita Kussmaul, MD - Primary  PHYSICIAN ASSISTANT:   ASSISTANTS: none   ANESTHESIA:   local and general  EBL:  Total I/O In: 700 [I.V.:700] Out: -   BLOOD ADMINISTERED:none  DRAINS: none   LOCAL MEDICATIONS USED:  MARCAINE     SPECIMEN:  Source of Specimen:  right breast tissue  DISPOSITION OF SPECIMEN:  PATHOLOGY  COUNTS:  YES  TOURNIQUET:  * No tourniquets in log *  DICTATION: .Dragon Dictation   After informed consent was obtained the patient was brought to the operating room and placed in the supine position on the operating room table. After adequate induction of general anesthesia the patient's right breast was prepped with ChloraPrep, allowed to dry, and draped in the usual sterile fashion. An appropriate timeout was performed. Previously an I-125 seed was placed in the upper inner quadrant of the right breast to mark an area of a complex sclerosing lesion. The neoprobe was set to I-125 in the area of radioactivity was readily identified in the upper inner quadrant of the right breast. A curvilinear incision was made along the upper inner edge of the areola with the 15 blade knife. The incision was carried through the skin and subcutaneous tissue sharply with the electrocautery. The dissection was then carried through the breast tissue into the upper inner quadrant using the neoprobe to direct the dissection. The Invuity retractors were used for this portion of the case. Once we approached the area of the radioactive seed a circular portion of breast tissue was excised sharply with the electrocautery around the radioactive seed while checking the  radioactivity frequently. During this portion of the dissection we did encounter the seed. The seed was removed and the x-rayed separately and then sent to pathology. Care was taken to remove more tissue from around the area that the seed was encountered. Once the specimen was removed it was oriented with the appropriate paint colors. A specimen radiograph was obtained that showed the clip to be near the center of the specimen. The specimen was then sent to pathology for further evaluation. Hemostasis was achieved using the Bovie electrocautery. The wound was irrigated with saline and infiltrated with quarter percent Marcaine. The deep layer of the wound was then closed with layers of interrupted 3-0 Vicryl stitches. The skin was then closed with interrupted 4-0 Monocryl subcuticular stitches. Dermabond dressings were applied. The patient tolerated the procedure well. At the end of the case all needle sponge and instrument counts were correct. The patient was then awakened and taken to recovery in stable condition.  PLAN OF CARE: Discharge to home after PACU  PATIENT DISPOSITION:  PACU - hemodynamically stable.   Delay start of Pharmacological VTE agent (>24hrs) due to surgical blood loss or risk of bleeding: not applicable

## 2016-08-27 NOTE — Transfer of Care (Signed)
Immediate Anesthesia Transfer of Care Note  Patient: Sonia Jackson  Procedure(s) Performed: Procedure(s): RIGHT BREAST LUMPECTOMY WITH RADIOACTIVE SEED LOCALIZATION (Right)  Patient Location: PACU  Anesthesia Type: General  Level of Consciousness: awake, alert , oriented, patient cooperative and responds to stimulation  Airway & Oxygen Therapy: Patient Spontanous Breathing and Patient connected to nasal cannula oxygen  Post-op Assessment: Report given to RN and Post -op Vital signs reviewed and stable  Post vital signs: Reviewed and stable  Last Vitals:  Vitals:   08/27/16 0950 08/27/16 1000  BP: 114/67   Pulse: 87 78  Resp: 16 16  Temp: 36.4 C     Last Pain:  Vitals:   08/27/16 0950  TempSrc:   PainSc: 9       Patients Stated Pain Goal: 2 ([id number])  Complications: No apparent anesthesia complications

## 2016-08-27 NOTE — Anesthesia Postprocedure Evaluation (Signed)
Anesthesia Post Note  Patient: Ihor Dow  Procedure(s) Performed: Procedure(s) (LRB): RIGHT BREAST LUMPECTOMY WITH RADIOACTIVE SEED LOCALIZATION (Right)  Patient location during evaluation: PACU Anesthesia Type: General Level of consciousness: awake and alert Pain management: pain level controlled Vital Signs Assessment: post-procedure vital signs reviewed and stable Respiratory status: spontaneous breathing, nonlabored ventilation and respiratory function stable Cardiovascular status: blood pressure returned to baseline and stable Postop Assessment: no signs of nausea or vomiting Anesthetic complications: no    Last Vitals:  Vitals:   08/27/16 1040 08/27/16 1047  BP:  133/69  Pulse:  72  Resp:  16  Temp: 36.7 C     Last Pain:  Vitals:   08/27/16 1030  TempSrc:   PainSc: 8                  Keonte Daubenspeck,W. EDMOND

## 2016-08-27 NOTE — Interval H&P Note (Signed)
History and Physical Interval Note:  08/27/2016 8:35 AM  Sonia Jackson  has presented today for surgery, with the diagnosis of RIGHT BREAST COMPLEX SCLEROSING LESION  The various methods of treatment have been discussed with the patient and family. After consideration of risks, benefits and other options for treatment, the patient has consented to  Procedure(s): RIGHT BREAST LUMPECTOMY WITH RADIOACTIVE SEED LOCALIZATION (Right) as a surgical intervention .  The patient's history has been reviewed, patient examined, no change in status, stable for surgery.  I have reviewed the patient's chart and labs.  Questions were answered to the patient's satisfaction.     TOTH III,PAUL S

## 2016-08-27 NOTE — H&P (Signed)
Sonia Jackson  Location: [facility] Patient #: [id number] DOB: Dec 04, 1955 Married / Language: English / Race: White Female   History of Present Illness  Patient words: csl.  The patient is a 60 year old female who presents with a breast mass. We are asked to see the patient in consultation by Dr. Everardo Beals to evaluate her for a complex sclerosing lesion of the right breast. The patient is a 60 year old white female who recently went for her routine screening mammogram. At that time she was found to have a small area of distortion in the upper inner right breast. This was biopsied and came back as a complex sclerosing lesion. She denies any breast pain or discharge from the nipple. She has no personal or family history of breast cancer.   Other Problems Ventura Sellers, CMA; 08/11/2016 10:54 AM) High blood pressure Kidney Stone  Past Surgical History Appendectomy Breast Biopsy Right. 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  Diagnostic Studies History  Colonoscopy 1-5 years ago Mammogram within last year  Allergies No Known Drug Allergies  Medication History  HydroCHLOROthiazide (25MG  Tablet, Oral) Active. Methocarbamol (500MG  Tablet, Oral) Active. Medications Reconciled  Social History  No alcohol use Tobacco use Never smoker.  Pregnancy / Birth History  Age at menarche 43 years. Gravida 2 Maternal age 46-25 Para 2 Regular periods    Review of Systems  General Not Present- Appetite Loss, Chills, Fatigue, Fever, Night Sweats, Weight Gain and Weight Loss. Skin Not Present- Change in Wart/Mole, Dryness, Hives, Jaundice, New Lesions, Non-Healing Wounds, Rash and Ulcer. HEENT Present- Hearing Loss and Wears glasses/contact lenses. Not Present- Earache, Hoarseness, Nose Bleed, Oral Ulcers, Ringing in the Ears, Seasonal Allergies, Sinus Pain, Sore Throat, Visual Disturbances  and Yellow Eyes. Breast Not Present- Breast Mass, Breast Pain, Nipple Discharge and Skin Changes. Cardiovascular Not Present- Chest Pain, Difficulty Breathing Lying Down, Leg Cramps, Palpitations, Rapid Heart Rate, Shortness of Breath and Swelling of Extremities. Gastrointestinal Not Present- Abdominal Pain, Bloating, Bloody Stool, Change in Bowel Habits, Chronic diarrhea, Constipation, Difficulty Swallowing, Excessive gas, Gets full quickly at meals, Hemorrhoids, Indigestion, Nausea, Rectal Pain and Vomiting. Female Genitourinary Not Present- Frequency, Nocturia, Painful Urination, Pelvic Pain and Urgency. Musculoskeletal Present- Muscle Pain. Not Present- Back Pain, Joint Pain, Joint Stiffness, Muscle Weakness and Swelling of Extremities. Neurological Present- Numbness. Not Present- Decreased Memory, Fainting, Headaches, Seizures, Tingling, Tremor, Trouble walking and Weakness. Psychiatric Not Present- Anxiety, Bipolar, Change in Sleep Pattern, Depression, Fearful and Frequent crying. Endocrine Not Present- Cold Intolerance, Excessive Hunger, Hair Changes, Heat Intolerance, Hot flashes and New Diabetes. Hematology Not Present- Blood Thinners, Easy Bruising, Excessive bleeding, Gland problems, HIV and Persistent Infections.  Vitals Weight: 147.13 lb Height: 59in Body Surface Area: 1.62 m Body Mass Index: 29.72 kg/m  BP: 114/72 (Sitting, Left Arm, Standard)       Physical Exam General Mental Status-Alert. General Appearance-Consistent with stated age. Hydration-Well hydrated. Voice-Normal.  Head and Neck Head-normocephalic, atraumatic with no lesions or palpable masses. Trachea-midline. Thyroid Gland Characteristics - normal size and consistency.  Eye Eyeball - Bilateral-Extraocular movements intact. Sclera/Conjunctiva - Bilateral-No scleral icterus.  Chest and Lung Exam Chest and lung exam reveals -quiet, even and easy respiratory effort with no use  of accessory muscles and on auscultation, normal breath sounds, no adventitious sounds and normal vocal resonance. Inspection Chest Wall - Normal. Back - normal.  Breast Note: There is no palpable mass in either breast. There is no palpable  axillary, supraclavicular, or cervical lymphadenopathy.   Cardiovascular Cardiovascular examination reveals -normal heart sounds, regular rate and rhythm with no murmurs and normal pedal pulses bilaterally.  Abdomen Inspection Inspection of the abdomen reveals - No Hernias. Skin - Scar - no surgical scars. Palpation/Percussion Palpation and Percussion of the abdomen reveal - Soft, Non Tender, No Rebound tenderness, No Rigidity (guarding) and No hepatosplenomegaly. Auscultation Auscultation of the abdomen reveals - Bowel sounds normal.  Neurologic Neurologic evaluation reveals -alert and oriented x 3 with no impairment of recent or remote memory. Mental Status-Normal.  Musculoskeletal Normal Exam - Left-Upper Extremity Strength Normal and Lower Extremity Strength Normal. Normal Exam - Right-Upper Extremity Strength Normal and Lower Extremity Strength Normal.  Lymphatic Head & Neck  General Head & Neck Lymphatics: Bilateral - Description - Normal. Axillary  General Axillary Region: Bilateral - Description - Normal. Tenderness - Non Tender. Femoral & Inguinal  Generalized Femoral & Inguinal Lymphatics: Bilateral - Description - Normal. Tenderness - Non Tender.    Assessment & Plan  SCLEROSING ADENOSIS OF BREAST, RIGHT (N60.21) Impression: The patient appears to have a complex sclerosing lesion in the upper inner right breast. Because of its abnormal appearance and because it can be considered a high risk lesion I would recommend that this area be removed. She would also like to have this done. I have discussed with her in detail the risks and benefits of the operation to remove this spot as well as some of the technical aspects  and she understands and wishes to proceed. I will plan for a right breast radioactive seed localized lumpectomy. Current Plans Pt Education - Breast Diseases: discussed with patient and provided information.

## 2016-08-27 NOTE — Anesthesia Preprocedure Evaluation (Addendum)
Anesthesia Evaluation  Patient identified by MRN, date of birth, ID band Patient awake    Reviewed: Allergy & Precautions, H&P , NPO status , Patient's Chart, lab work & pertinent test results  History of Anesthesia Complications (+) PONV  Airway Mallampati: III  TM Distance: >3 FB Neck ROM: Full    Dental no notable dental hx. (+) Upper Dentures, Lower Dentures, Dental Advisory Given   Pulmonary neg pulmonary ROS,    Pulmonary exam normal breath sounds clear to auscultation       Cardiovascular hypertension, Pt. on medications  Rhythm:Regular Rate:Normal     Neuro/Psych negative neurological ROS  negative psych ROS   GI/Hepatic negative GI ROS, Neg liver ROS,   Endo/Other  negative endocrine ROS  Renal/GU negative Renal ROS  negative genitourinary   Musculoskeletal   Abdominal   Peds  Hematology negative hematology ROS (+)   Anesthesia Other Findings   Reproductive/Obstetrics negative OB ROS                            Anesthesia Physical Anesthesia Plan  ASA: II  Anesthesia Plan: General   Post-op Pain Management:    Induction: Intravenous  Airway Management Planned: LMA  Additional Equipment:   Intra-op Plan:   Post-operative Plan: Extubation in OR  Informed Consent: I have reviewed the patients History and Physical, chart, labs and discussed the procedure including the risks, benefits and alternatives for the proposed anesthesia with the patient or authorized representative who has indicated his/her understanding and acceptance.   Dental advisory given  Plan Discussed with: CRNA  Anesthesia Plan Comments:         Anesthesia Quick Evaluation

## 2016-08-28 ENCOUNTER — Encounter (HOSPITAL_COMMUNITY): Payer: Self-pay | Admitting: General Surgery

## 2016-09-18 ENCOUNTER — Telehealth: Payer: Self-pay | Admitting: Hematology

## 2016-09-18 ENCOUNTER — Telehealth: Payer: Self-pay | Admitting: *Deleted

## 2016-09-18 ENCOUNTER — Encounter: Payer: Self-pay | Admitting: Hematology

## 2016-09-18 NOTE — Telephone Encounter (Signed)
Pt confirmed appt, verified demo and insurance, mailed pt letter, faxed referring provider appt date/time. °

## 2016-09-18 NOTE — Telephone Encounter (Signed)
Mailed packet w/ directions to pt.

## 2016-10-01 ENCOUNTER — Ambulatory Visit (HOSPITAL_BASED_OUTPATIENT_CLINIC_OR_DEPARTMENT_OTHER): Payer: BLUE CROSS/BLUE SHIELD | Admitting: Hematology

## 2016-10-01 ENCOUNTER — Encounter: Payer: Self-pay | Admitting: Hematology

## 2016-10-01 DIAGNOSIS — D241 Benign neoplasm of right breast: Secondary | ICD-10-CM | POA: Diagnosis not present

## 2016-10-01 DIAGNOSIS — N6021 Fibroadenosis of right breast: Secondary | ICD-10-CM

## 2016-10-01 NOTE — Progress Notes (Signed)
[facility]  [phone] Fax:([phone]) [phone]  Clinic New Consult Note   Patient Care Team: No Pcp Per Patient as PCP - General (General Practice) 10/01/2016  Referring physician Dr. Marlou Starks  CHIEF COMPLAINTS/PURPOSE OF CONSULTATION:  Breast lesions   HISTORY OF PRESENTING ILLNESS:  Sonia Jackson 60 y.o. 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 She is accompanied by her daughter to my clinic today. She was referred by her surgeon Dr. Marlou Starks.   This was discovered by screening mammogram, which showed architectural distortion in the right breast. She underwent core needle biopsy of the right breast lesion, it showed complex sclerosing lesion with usual ductal hyperplasia. She was referred to breast surgeon Dr. Marlou Starks, and underwent right lumpectomy on 08/27/2016. She has recovered well from surgery, denies any significant pain or other symptoms.  She has no family history of breast cancer. She is post menopause, works as a Quarry manager for [employer].  MEDICAL HISTORY:  Past Medical History:  Diagnosis Date  . Carpal tunnel syndrome   . Hard of hearing   . High cholesterol    patient denies  . History of kidney stones   . Hypertension   . PONV (postoperative nausea and vomiting)    nausea only  . Renal disorder     SURGICAL HISTORY: Past Surgical History:  Procedure Laterality Date  . ABDOMINAL HYSTERECTOMY    . APPENDECTOMY    . arm surgery Left   . BREAST LUMPECTOMY WITH RADIOACTIVE SEED LOCALIZATION Right 08/27/2016   Procedure: RIGHT BREAST LUMPECTOMY WITH RADIOACTIVE SEED LOCALIZATION;  Surgeon: Autumn Messing III, MD;  Location: [facility];  Service: General;  Laterality: Right;  . CARPAL TUNNEL RELEASE Left   . CHOLECYSTECTOMY    . COLONOSCOPY    . ESOPHAGOGASTRODUODENOSCOPY    . FOOT SURGERY Bilateral   . KNEE SURGERY Left     SOCIAL HISTORY: Social History   Social History  . Marital  status: Single    Spouse name: N/A  . Number of children: N/A  . Years of education: N/A   Occupational History  . Not on file.   Social History Main Topics  . Smoking status: Never Smoker  . Smokeless tobacco: Never Used  . Alcohol use No  . Drug use: No  . Sexual activity: Yes    Birth control/ protection: Surgical   Other Topics Concern  . Not on file   Social History Narrative  . No narrative on file    FAMILY HISTORY: Family History  Problem Relation Age of Onset  . Diabetes Father     ALLERGIES:  is allergic to compazine.  MEDICATIONS:  Current Outpatient Prescriptions  Medication Sig Dispense Refill  . ALPRAZolam (XANAX) 0.5 MG tablet Take 0.5 mg by mouth at bedtime as needed for sleep.    . hydrochlorothiazide 25 MG tablet Take 25 mg by mouth every morning.      . methocarbamol (ROBAXIN) 500 MG tablet Take 500 mg by mouth every 6 (six) hours as needed for muscle spasms.    Marland Kitchen HYDROcodone-acetaminophen (NORCO/VICODIN) 5-325 MG tablet Take 1-2 tablets by mouth every 4 (four) hours as needed for moderate pain or severe pain. (Patient not taking: Reported on 10/01/2016) 20 tablet 0   No current facility-administered medications for this visit.     REVIEW OF SYSTEMS:   Constitutional: Denies fevers, chills or abnormal night sweats Eyes: Denies blurriness of vision, double vision  or watery eyes Ears, nose, mouth, throat, and face: Denies mucositis or sore throat Respiratory: Denies cough, dyspnea or wheezes Cardiovascular: Denies palpitation, chest discomfort or lower extremity swelling Gastrointestinal:  Denies nausea, heartburn or change in bowel habits Skin: Denies abnormal skin rashes Lymphatics: Denies new lymphadenopathy or easy bruising Neurological:Denies numbness, tingling or new weaknesses Behavioral/Psych: Mood is stable, no new changes  All other systems were reviewed with the patient and are negative.  PHYSICAL EXAMINATION: ECOG PERFORMANCE STATUS:  0 - Asymptomatic  Vitals:   10/01/16 1421  BP: 123/63  Pulse: 83  Resp: 18  Temp: 98.2 F (36.8 C)   Filed Weights   10/01/16 1421  Weight: 147 lb 1.6 oz (66.7 kg)    GENERAL:alert, no distress and comfortable SKIN: skin color, texture, turgor are normal, no rashes or significant lesions EYES: normal, conjunctiva are pink and non-injected, sclera clear OROPHARYNX:no exudate, no erythema and lips, buccal mucosa, and tongue normal  NECK: supple, thyroid normal size, non-tender, without nodularity LYMPH:  no palpable lymphadenopathy in the cervical, axillary or inguinal LUNGS: clear to auscultation and percussion with normal breathing effort HEART: regular rate & rhythm and no murmurs and no lower extremity edema ABDOMEN:abdomen soft, non-tender and normal bowel sounds Musculoskeletal:no cyanosis of digits and no clubbing  PSYCH: alert & oriented x 3 with fluent speech NEURO: no focal motor/sensory deficits Breasts: Breast inspection showed them to be symmetrical with no nipple discharge. The surgical incision in the right breast is healing well, no discharge or skin erythema. Palpation of the breasts and axilla revealed no obvious mass that I could appreciate.   LABORATORY DATA:  I have reviewed the data as listed CBC Latest Ref Rng & Units 08/24/2016 01/09/2015 01/07/2015  WBC 4.0 - 10.5 K/uL 7.3 8.2 8.5  Hemoglobin 12.0 - 15.0 g/dL 14.7 13.0 12.5  Hematocrit 36.0 - 46.0 % 44.3 38.9 36.8  Platelets 150 - 400 K/uL 215 209 186   CMP Latest Ref Rng & Units 08/24/2016 01/09/2015 01/07/2015  Glucose 65 - 99 mg/dL 102(H) 123(H) 96  BUN 6 - 20 mg/dL 12 12 17   Creatinine 0.44 - 1.00 mg/dL 0.70 0.93 1.00  Sodium 135 - 145 mmol/L 139 136 137  Potassium 3.5 - 5.1 mmol/L 3.7 3.5 4.0  Chloride 101 - 111 mmol/L 105 106 103  CO2 22 - 32 mmol/L 27 24 26   Calcium 8.9 - 10.3 mg/dL 9.4 9.1 9.1  Total Protein 6.0 - 8.3 g/dL - - 6.7  Total Bilirubin 0.3 - 1.2 mg/dL - - 0.4  Alkaline Phos 39 -  117 U/L - - 92  AST 0 - 37 U/L - - 23  ALT 0 - 35 U/L - - 24   PATHOLOGY REPORT  Diagnosis 08/27/2016 1. Breast, lumpectomy, Right - COMPLEX SCLEROSING LESION WITH USUAL DUCTAL HYPERPLASIA AND CALCIFICATIONS. - FIBROCYSTIC CHANGES WITH USUAL DUCTAL HYPERPLASIA AND CALCIFICATIONS. - PSEUDOANGIOMATOUS STROMAL HYPERPLASIA ([place]). - PREVIOUS BIOPSY SITE. - NO EVIDENCE OF MALIGNANCY. 2. Hardware, Surgical, Radioactive seed - GROSS EXAMINATION: RADIOACTIVE SEED.   RADIOGRAPHIC STUDIES: I have personally reviewed the radiological images as listed and agreed with the findings in the report. No results found.  ASSESSMENT & PLAN:  60 year old female, with past medical history of hypertension, presented with abnormal screening mammogram.  1. 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 -She has multiple benign breast lesions, including complex sclerosing lesion with usual ductal  hyperplasia and calcification, fibrocystic changes, pseudo-angiomatous stromal hyperplasia, those are not high risk for breast cancer. -She does not have any other significant high risk factor for breast cancer, such as family history -she does not need chemoprevention for breast cancer.  -We discussed the incidence of breast cancer in general population, risk factors for breast cancer, especially obesity, life style etc. -I encouraged her to eat healthy, exercise regularly, to be positive, and maintain normal weight, to reduce her risk of breast cancer. -I strongly encouraged her to continue annual screening mammogram with 3-D, do self exam, and a follow-up with her primary care physician or gynecologist once a year for breast exam. She agrees with the plan. -I'll only see her as needed in the future.   All questions were answered. The patient knows to call the clinic with any problems, questions or  concerns. I spent 30 minutes counseling the patient face to face. The total time spent in the appointment was 45 minutes and more than 50% was on counseling.     Truitt Merle, MD 10/01/2016 3:14 PM

## 2017-01-02 ENCOUNTER — Ambulatory Visit (HOSPITAL_COMMUNITY)
Admission: EM | Admit: 2017-01-02 | Discharge: 2017-01-02 | Disposition: A | Discharge status: Home / Self Care | Payer: BLUE CROSS/BLUE SHIELD | Attending: Internal Medicine | Admitting: Internal Medicine

## 2017-01-02 ENCOUNTER — Encounter (HOSPITAL_COMMUNITY): Payer: Self-pay | Admitting: *Deleted

## 2017-01-02 DIAGNOSIS — R197 Diarrhea, unspecified: Secondary | ICD-10-CM

## 2017-01-02 DIAGNOSIS — K529 Noninfective gastroenteritis and colitis, unspecified: Secondary | ICD-10-CM

## 2017-01-02 DIAGNOSIS — R110 Nausea: Secondary | ICD-10-CM

## 2017-01-02 MED ORDER — ONDANSETRON 4 MG PO TBDP: 4.0000 mg | ORAL_TABLET | Freq: Three times a day (TID) | ORAL | 0 refills | Status: DC | PRN

## 2017-01-02 MED ORDER — ONDANSETRON 4 MG PO TBDP
4.0000 mg | ORAL_TABLET | Freq: Once | ORAL | Status: AC
  Administered 2017-01-02: 4 mg via ORAL

## 2017-01-02 MED ORDER — ONDANSETRON 4 MG PO TBDP
ORAL_TABLET | ORAL | Status: AC
  Filled 2017-01-02: qty 1

## 2017-01-02 NOTE — ED Triage Notes (Signed)
C/O diarrhea and nausea x 2 days; states multiple episodes diarrhea without fever or abd pain.  States unable to vomit.  Spouse had same sxs 2 wks ago.

## 2017-01-02 NOTE — ED Provider Notes (Signed)
CSN: [id number]     Arrival date & time 01/02/17  1710 History   None    Chief Complaint  Patient presents with  . Diarrhea  . Nausea   (Consider location/radiation/quality/duration/timing/severity/associated sxs/prior Treatment) Patient c/o nausea and diarrhea for 2 days.   The history is provided by the patient.  Diarrhea  Quality:  Watery Severity:  Moderate Onset quality:  Sudden Duration:  2 days Timing:  Constant Progression:  Worsening Relieved by:  Nothing Worsened by:  Nothing Ineffective treatments:  None tried   Past Medical History:  Diagnosis Date  . Carpal tunnel syndrome   . Hard of hearing   . High cholesterol    patient denies  . History of kidney stones   . Hypertension   . PONV (postoperative nausea and vomiting)    nausea only  . Renal disorder    Past Surgical History:  Procedure Laterality Date  . ABDOMINAL HYSTERECTOMY    . APPENDECTOMY    . arm surgery Left   . BREAST LUMPECTOMY WITH RADIOACTIVE SEED LOCALIZATION Right 08/27/2016   Procedure: RIGHT BREAST LUMPECTOMY WITH RADIOACTIVE SEED LOCALIZATION;  Surgeon: Autumn Messing III, MD;  Location: [facility];  Service: General;  Laterality: Right;  . CARPAL TUNNEL RELEASE Left   . CHOLECYSTECTOMY    . COLONOSCOPY    . ESOPHAGOGASTRODUODENOSCOPY    . FOOT SURGERY Bilateral   . KNEE SURGERY Left    Family History  Problem Relation Age of Onset  . Diabetes Father    Social History  Substance Use Topics  . Smoking status: Never Smoker  . Smokeless tobacco: Never Used  . Alcohol use No   OB History    No data available     Review of Systems  Constitutional: Negative.   HENT: Negative.   Eyes: Negative.   Respiratory: Negative.   Cardiovascular: Negative.   Gastrointestinal: Positive for diarrhea.  Endocrine: Negative.   Genitourinary: Negative.   Musculoskeletal: Negative.   Allergic/Immunologic: Negative.   Neurological: Negative.   Psychiatric/Behavioral: Negative.     Allergies   Compazine  Home Medications   Prior to Admission medications   Medication Sig Start Date End Date Taking? Authorizing Provider  ALPRAZolam Duanne Moron) 0.5 MG tablet Take 0.5 mg by mouth at bedtime as needed for sleep.   Yes Historical Provider, MD  hydrochlorothiazide 25 MG tablet Take 25 mg by mouth every morning.     Yes Historical Provider, MD  HYDROcodone-acetaminophen (NORCO/VICODIN) 5-325 MG tablet Take 1-2 tablets by mouth every 4 (four) hours as needed for moderate pain or severe pain. Patient not taking: Reported on 10/01/2016 08/27/16   Autumn Messing III, MD  methocarbamol (ROBAXIN) 500 MG tablet Take 500 mg by mouth every 6 (six) hours as needed for muscle spasms.    Historical Provider, MD  ondansetron (ZOFRAN ODT) 4 MG disintegrating tablet Take 1 tablet (4 mg total) by mouth every 8 (eight) hours as needed for nausea or vomiting. 01/02/17   Lysbeth Penner, FNP   Meds Ordered and Administered this Visit   Medications  ondansetron (ZOFRAN-ODT) disintegrating tablet 4 mg (not administered)    BP 115/69   Pulse 66   Temp 98.1 F (36.7 C) (Oral)   Resp 16   SpO2 96%  No data found.   Physical Exam  Constitutional: She appears well-developed.  HENT:  Head: Normocephalic and atraumatic.  Right Ear: External ear normal.  Left Ear: External ear normal.  Mouth/Throat: Oropharynx is clear and moist.  Eyes: Conjunctivae and EOM are normal. Pupils are equal, round, and reactive to light.  Neck: Normal range of motion. Neck supple.  Cardiovascular: Normal rate, regular rhythm and normal heart sounds.   Pulmonary/Chest: Effort normal and breath sounds normal.  Abdominal: Soft. Bowel sounds are normal.  Nursing note and vitals reviewed.   Urgent Care Course     Procedures (including critical care time)  Labs Review Labs Reviewed - No data to display  Imaging Review No results found.   Visual Acuity Review  Right Eye Distance:   Left Eye Distance:   Bilateral  Distance:    Right Eye Near:   Left Eye Near:    Bilateral Near:         MDM   1. Nausea   2. Diarrhea, unspecified type   3. Gastroenteritis    Zofran ODT 4mg  now Zofran ODT 4mg  one po tid prn #21  Push po fluids, rest, tylenol and motrin otc prn as directed for fever, arthralgias, and myalgias.  Follow up prn if sx's continue or persist.    Lysbeth Penner, FNP 01/02/17 [id number]

## 2017-06-08 ENCOUNTER — Other Ambulatory Visit: Payer: Self-pay | Admitting: *Deleted

## 2017-06-08 DIAGNOSIS — Z1231 Encounter for screening mammogram for malignant neoplasm of breast: Secondary | ICD-10-CM

## 2017-07-08 ENCOUNTER — Ambulatory Visit
Admission: RE | Admit: 2017-07-08 | Discharge: 2017-07-08 | Disposition: A | Discharge status: Home / Self Care | Payer: BLUE CROSS/BLUE SHIELD | Source: Ambulatory Visit | Attending: *Deleted | Admitting: *Deleted

## 2017-07-08 DIAGNOSIS — Z1231 Encounter for screening mammogram for malignant neoplasm of breast: Secondary | ICD-10-CM

## 2017-10-05 ENCOUNTER — Ambulatory Visit (HOSPITAL_COMMUNITY)
Admission: EM | Admit: 2017-10-05 | Discharge: 2017-10-05 | Disposition: A | Discharge status: Home / Self Care | Payer: BLUE CROSS/BLUE SHIELD | Attending: Emergency Medicine | Admitting: Emergency Medicine

## 2017-10-05 ENCOUNTER — Encounter (HOSPITAL_COMMUNITY): Payer: Self-pay | Admitting: Emergency Medicine

## 2017-10-05 DIAGNOSIS — B9789 Other viral agents as the cause of diseases classified elsewhere: Secondary | ICD-10-CM

## 2017-10-05 DIAGNOSIS — J069 Acute upper respiratory infection, unspecified: Secondary | ICD-10-CM

## 2017-10-05 MED ORDER — ONDANSETRON 4 MG PO TBDP: 4.0000 mg | ORAL_TABLET | Freq: Three times a day (TID) | ORAL | 0 refills | Status: AC | PRN

## 2017-10-05 MED ORDER — BENZONATATE 100 MG PO CAPS: 100.0000 mg | ORAL_CAPSULE | Freq: Three times a day (TID) | ORAL | 0 refills | Status: AC

## 2017-10-05 NOTE — Discharge Instructions (Signed)
You likely having a viral upper respiratory infection. We recommended symptom control. I expect your symptoms to start improving in the next 1-2 weeks.   1. Take a daily allergy pill/anti-histamine like Zyrtec, Claritin, or Store brand consistently for 2 weeks  2. For congestion you may try an oral decongestant like Mucinex or sudafed. You may also try intranasal flonase nasal spray or saline irrigations (neti pot, sinus cleanse)  3. For your sore throat you may try cepacol lozenges, salt water gargles, throat spray. Treatment of congestion may also help your sore throat.  4. For cough you may try Tessalon (prescribed)  Robitussen, Mucinex DM  5. Take Tylenol or Ibuprofen to help with pain/inflammation  6. Stay hydrated, drink plenty of fluids to keep throat coated and less irritated  Honey Tea For sore throat try using a honey-based tea. Use 3 teaspoons of honey with juice squeezed from half lemon. Place shaved pieces of ginger into 1/2-1 cup of water and warm over stove top. Then mix the ingredients and repeat every 4 hours as needed.

## 2017-10-05 NOTE — ED Triage Notes (Signed)
Pt sts URI sx with cough and pain with cough x 3 days

## 2017-10-05 NOTE — ED Provider Notes (Signed)
[place]    CSN: [id number] Arrival date & time: 10/05/17  1001     History   Chief Complaint Chief Complaint  Patient presents with  . Cough    HPI Sonia Jackson is a 61 y.o. female presenting with 3 days of URI symptoms. Symptoms include cough, congestion, sneezing, sore throat, mild chest discomfort. Feeling hot and cold. No fevers. Mild nausea at night from mucous. Has taken tylenol, without help. Works as Chief Strategy Officer, around someone with similar symptoms.   HPI  Past Medical History:  Diagnosis Date  . Carpal tunnel syndrome   . Hard of hearing   . High cholesterol    patient denies  . History of kidney stones   . Hypertension   . PONV (postoperative nausea and vomiting)    nausea only  . Renal disorder     Patient Active Problem List   Diagnosis Date Noted  . Sclerosing adenosis of breast, right 10/01/2016    Past Surgical History:  Procedure Laterality Date  . ABDOMINAL HYSTERECTOMY    . APPENDECTOMY    . arm surgery Left   . BREAST EXCISIONAL BIOPSY Right 2017   benign  . BREAST LUMPECTOMY WITH RADIOACTIVE SEED LOCALIZATION Right 08/27/2016   Procedure: RIGHT BREAST LUMPECTOMY WITH RADIOACTIVE SEED LOCALIZATION;  Surgeon: Autumn Messing III, MD;  Location: [facility];  Service: General;  Laterality: Right;  . CARPAL TUNNEL RELEASE Left   . CHOLECYSTECTOMY    . COLONOSCOPY    . ESOPHAGOGASTRODUODENOSCOPY    . FOOT SURGERY Bilateral   . KNEE SURGERY Left     OB History    No data available       Home Medications    Prior to Admission medications   Medication Sig Start Date End Date Taking? Authorizing Provider  ALPRAZolam Duanne Moron) 0.5 MG tablet Take 0.5 mg by mouth at bedtime as needed for sleep.    [provider]  benzonatate (TESSALON) 100 MG capsule Take 1 capsule (100 mg total) by mouth every 8 (eight) hours for 7 days. 10/05/17 10/12/17  Rashod Gougeon C, PA-C  hydrochlorothiazide 25 MG tablet Take 25 mg by mouth  every morning.      [provider]  HYDROcodone-acetaminophen (NORCO/VICODIN) 5-325 MG tablet Take 1-2 tablets by mouth every 4 (four) hours as needed for moderate pain or severe pain. Patient not taking: Reported on 10/01/2016 08/27/16   Autumn Messing III, MD  methocarbamol (ROBAXIN) 500 MG tablet Take 500 mg by mouth every 6 (six) hours as needed for muscle spasms.    [provider]  ondansetron (ZOFRAN ODT) 4 MG disintegrating tablet Take 1 tablet (4 mg total) by mouth every 8 (eight) hours as needed for up to 5 days for nausea or vomiting. 10/05/17 10/10/17  Janith Lima, PA-C    Family History Family History  Problem Relation Age of Onset  . Diabetes Father     Social History Social History   Tobacco Use  . Smoking status: Never Smoker  . Smokeless tobacco: Never Used  Substance Use Topics  . Alcohol use: No  . Drug use: No     Allergies   Compazine   Review of Systems Review of Systems  Constitutional: Positive for chills. Negative for fever.  HENT: Positive for congestion, ear pain, rhinorrhea, sinus pressure, sneezing and sore throat.   Eyes: Negative for pain and itching.  Respiratory: Positive for cough and chest tightness. Negative for shortness of breath.  Cardiovascular: Negative for chest pain.  Gastrointestinal: Positive for nausea. Negative for abdominal pain, diarrhea and vomiting.  Musculoskeletal: Positive for myalgias. Negative for back pain.  Skin: Negative for rash.  Neurological: Positive for headaches. Negative for dizziness, weakness and light-headedness.     Physical Exam Triage Vital Signs ED Triage Vitals [10/05/17 1013]  Enc Vitals Group     BP (!) 155/78     Pulse Rate 61     Resp 18     Temp 97.6 F (36.4 C)     Temp Source Oral     SpO2 96 %     Weight      Height      Head Circumference      Peak Flow      Pain Score      Pain Loc      Pain Edu?      Excl. in [place]?    No data found.  Updated Vital  Signs BP (!) 155/78 (BP Location: Right Arm)   Pulse 61   Temp 97.6 F (36.4 C) (Oral)   Resp 18   SpO2 96%   Visual Acuity Right Eye Distance:   Left Eye Distance:   Bilateral Distance:    Right Eye Near:   Left Eye Near:    Bilateral Near:     Physical Exam  Constitutional: She appears well-developed and well-nourished. No distress.  HENT:  Head: Normocephalic and atraumatic.  Right Ear: Tympanic membrane, external ear and ear canal normal. Tympanic membrane is not erythematous.  Left Ear: Tympanic membrane, external ear and ear canal normal. Tympanic membrane is not erythematous.  Mouth/Throat: Uvula is midline and mucous membranes are normal. No uvula swelling. Posterior oropharyngeal erythema present. No oropharyngeal exudate. No tonsillar exudate.  Erythematous nasal turbinates with rhinnorhea present.  Eyes: Conjunctivae are normal.  Neck: Neck supple.  Cardiovascular: Normal rate and regular rhythm.  No murmur heard. Pulmonary/Chest: Effort normal and breath sounds normal. No respiratory distress. She has no wheezes. She has no rales.  Abdominal: Soft. There is no tenderness.  Musculoskeletal: She exhibits no edema.  Neurological: She is alert.  Skin: Skin is warm and dry.  Psychiatric: She has a normal mood and affect.  Nursing note and vitals reviewed.    UC Treatments / Results  Labs (all labs ordered are listed, but only abnormal results are displayed) Labs Reviewed - No data to display  EKG  EKG Interpretation None       Radiology No results found.  Procedures Procedures (including critical care time)  Medications Ordered in UC Medications - No data to display   Initial Impression / Assessment and Plan / UC Course  I have reviewed the triage vital signs and the nursing notes.  Pertinent labs & imaging results that were available during my care of the patient were reviewed by me and considered in my medical decision making (see chart for  details).    Patient presents with symptoms likely from a viral upper respiratory infection. Differential includes bacterial pneumonia, sinusitis, allergic rhinitis, acute bronchitis. Do not suspect underlying cardiopulmonary process. Symptoms seem unlikely related to ACS, CHF or COPD exacerbations, pneumonia, pneumothorax. Patient is nontoxic appearing and not in need of emergent medical intervention. No fever, no tachycardia, lungs clear to auscultation.  Recommended symptom control with over the counter medications: Daily oral anti-histamine, Oral decongestant or IN corticosteroid, saline irrigations, cepacol lozenges, Robitussin, Delsym, honey tea. Tessalon prescribed for cough, zofran for nausea at night.  Return if symptoms fail to improve in 1-2 weeks or you develop shortness of breath, chest pain, severe headache. Patient states understanding and is agreeable.    Final Clinical Impressions(s) / UC Diagnoses   Final diagnoses:  Viral URI with cough    ED Discharge Orders        Ordered    benzonatate (TESSALON) 100 MG capsule  Every 8 hours     10/05/17 1027    ondansetron (ZOFRAN ODT) 4 MG disintegrating tablet  Every 8 hours PRN     10/05/17 1031       Controlled Substance Prescriptions [place] Controlled Substance Registry consulted? Not Applicable   Janith Lima, [place] 10/05/17 1044

## 2017-12-13 ENCOUNTER — Other Ambulatory Visit: Payer: Self-pay | Admitting: *Deleted

## 2017-12-13 DIAGNOSIS — Z1231 Encounter for screening mammogram for malignant neoplasm of breast: Secondary | ICD-10-CM

## 2018-07-11 ENCOUNTER — Ambulatory Visit
Admission: RE | Admit: 2018-07-11 | Discharge: 2018-07-11 | Disposition: A | Discharge status: Home / Self Care | Payer: BLUE CROSS/BLUE SHIELD | Source: Ambulatory Visit | Attending: *Deleted | Admitting: *Deleted

## 2018-07-11 DIAGNOSIS — Z1231 Encounter for screening mammogram for malignant neoplasm of breast: Secondary | ICD-10-CM

## 2018-11-21 ENCOUNTER — Other Ambulatory Visit: Payer: Self-pay | Admitting: *Deleted

## 2018-11-21 DIAGNOSIS — Z1231 Encounter for screening mammogram for malignant neoplasm of breast: Secondary | ICD-10-CM

## 2019-07-13 ENCOUNTER — Ambulatory Visit
Admission: RE | Admit: 2019-07-13 | Discharge: 2019-07-13 | Disposition: A | Discharge status: Home / Self Care | Payer: BC Managed Care – PPO | Source: Ambulatory Visit | Attending: *Deleted | Admitting: *Deleted

## 2019-07-13 ENCOUNTER — Other Ambulatory Visit: Payer: Self-pay

## 2019-07-13 DIAGNOSIS — Z1231 Encounter for screening mammogram for malignant neoplasm of breast: Secondary | ICD-10-CM

## 2019-12-03 ENCOUNTER — Ambulatory Visit (HOSPITAL_COMMUNITY)
Admission: EM | Admit: 2019-12-03 | Discharge: 2019-12-03 | Disposition: A | Discharge status: Home / Self Care | Payer: 59 | Attending: Emergency Medicine | Admitting: Emergency Medicine

## 2019-12-03 ENCOUNTER — Encounter (HOSPITAL_COMMUNITY): Payer: Self-pay

## 2019-12-03 ENCOUNTER — Other Ambulatory Visit: Payer: Self-pay

## 2019-12-03 DIAGNOSIS — N390 Urinary tract infection, site not specified: Secondary | ICD-10-CM | POA: Diagnosis not present

## 2019-12-03 DIAGNOSIS — M545 Low back pain, unspecified: Secondary | ICD-10-CM

## 2019-12-03 DIAGNOSIS — R350 Frequency of micturition: Secondary | ICD-10-CM | POA: Insufficient documentation

## 2019-12-03 LAB — POCT URINALYSIS DIP (DEVICE)
Bilirubin Urine: NEGATIVE
Glucose, UA: NEGATIVE mg/dL
Ketones, ur: NEGATIVE mg/dL
Nitrite: NEGATIVE
Protein, ur: NEGATIVE mg/dL
Specific Gravity, Urine: >=1.03 (ref 1.005–1.030)
Urobilinogen, UA: 0.2 mg/dL (ref 0.0–1.0)
pH: 7 (ref 5.0–8.0)

## 2019-12-03 MED ORDER — PHENAZOPYRIDINE HCL 100 MG PO TABS: 100.0000 mg | ORAL_TABLET | Freq: Three times a day (TID) | ORAL | 0 refills | Status: DC | PRN

## 2019-12-03 MED ORDER — CEPHALEXIN 500 MG PO CAPS: 500.0000 mg | ORAL_CAPSULE | Freq: Four times a day (QID) | ORAL | 0 refills | Status: DC

## 2019-12-03 NOTE — ED Provider Notes (Signed)
[place]    CSN: [id number] Arrival date & time: 12/03/19  1219      History   Chief Complaint Chief Complaint  Patient presents with  . Back Pain  . Dysuria    HPI Sonia Jackson is a 64 y.o. female.   Low back pain with frequency for 3 days now. States that she thinks she has an uti this is the same symptoms as before. Has not taken anything pta . No n/v/d      Past Medical History:  Diagnosis Date  . Carpal tunnel syndrome   . Hard of hearing   . High cholesterol    patient denies  . History of kidney stones   . Hypertension   . PONV (postoperative nausea and vomiting)    nausea only  . Renal disorder     Patient Active Problem List   Diagnosis Date Noted  . Sclerosing adenosis of breast, right 10/01/2016    Past Surgical History:  Procedure Laterality Date  . ABDOMINAL HYSTERECTOMY    . APPENDECTOMY    . arm surgery Left   . BREAST EXCISIONAL BIOPSY Right 2017   benign  . BREAST LUMPECTOMY WITH RADIOACTIVE SEED LOCALIZATION Right 08/27/2016   Procedure: RIGHT BREAST LUMPECTOMY WITH RADIOACTIVE SEED LOCALIZATION;  Surgeon: Autumn Messing III, MD;  Location: [facility];  Service: General;  Laterality: Right;  . CARPAL TUNNEL RELEASE Left   . CHOLECYSTECTOMY    . COLONOSCOPY    . ESOPHAGOGASTRODUODENOSCOPY    . FOOT SURGERY Bilateral   . KNEE SURGERY Left     OB History   No obstetric history on file.      Home Medications    Prior to Admission medications   Medication Sig Start Date End Date Taking? Authorizing Provider  ALPRAZolam Duanne Moron) 0.5 MG tablet Take 0.5 mg by mouth at bedtime as needed for sleep.   Yes [provider]  cephALEXin (KEFLEX) 500 MG capsule Take 1 capsule (500 mg total) by mouth 4 (four) times daily. 12/03/19   Marney Setting, NP  hydrochlorothiazide 25 MG tablet Take 25 mg by mouth every morning.      [provider]  HYDROcodone-acetaminophen (NORCO/VICODIN) 5-325 MG tablet Take 1-2 tablets  by mouth every 4 (four) hours as needed for moderate pain or severe pain. Patient not taking: Reported on 10/01/2016 08/27/16   Autumn Messing III, MD  methocarbamol (ROBAXIN) 500 MG tablet Take 500 mg by mouth every 6 (six) hours as needed for muscle spasms.    [provider]  phenazopyridine (PYRIDIUM) 100 MG tablet Take 1 tablet (100 mg total) by mouth 3 (three) times daily as needed for pain. 12/03/19   Marney Setting, NP    Family History Family History  Problem Relation Age of Onset  . Diabetes Father   . Breast cancer Neg Hx     Social History Social History   Tobacco Use  . Smoking status: Never Smoker  . Smokeless tobacco: Never Used  Substance Use Topics  . Alcohol use: No  . Drug use: No     Allergies   Compazine and Compazine  [prochlorperazine]   Review of Systems Review of Systems  Constitutional: Negative.   Respiratory: Negative.   Cardiovascular: Negative.   Gastrointestinal: Negative.   Genitourinary: Positive for flank pain and frequency.  Neurological: Negative.      Physical Exam Triage Vital Signs ED Triage Vitals  Enc Vitals Group     BP 12/03/19  1309 (!) 145/109     Pulse Rate 12/03/19 1309 73     Resp 12/03/19 1309 18     Temp 12/03/19 1309 98.2 F (36.8 C)     Temp Source 12/03/19 1309 Oral     SpO2 --      Weight --      Height --      Head Circumference --      Peak Flow --      Pain Score 12/03/19 1306 9     Pain Loc --      Pain Edu? --      Excl. in [place]? --    No data found.  Updated Vital Signs BP (!) 145/109 (BP Location: Right Arm)   Pulse 73   Temp 98.2 F (36.8 C) (Oral)   Resp 18   Visual Acuity Right Eye Distance:   Left Eye Distance:   Bilateral Distance:    Right Eye Near:   Left Eye Near:    Bilateral Near:     Physical Exam Cardiovascular:     Rate and Rhythm: Normal rate.  Pulmonary:     Effort: Pulmonary effort is normal.  Abdominal:     General: Abdomen is flat.     Tenderness:  There is right CVA tenderness.     Comments: Lower abd tenderness on palpation   Skin:    General: Skin is warm.  Neurological:     General: No focal deficit present.     Mental Status: She is alert.      UC Treatments / Results  Labs (all labs ordered are listed, but only abnormal results are displayed) Labs Reviewed  POCT URINALYSIS DIP (DEVICE) - Abnormal; Notable for the following components:      Result Value   Hgb urine dipstick TRACE (*)    Leukocytes,Ua TRACE (*)    All other components within normal limits  URINE CULTURE    EKG   Radiology No results found.  Procedures Procedures (including critical care time)  Medications Ordered in UC Medications - No data to display  Initial Impression / Assessment and Plan / UC Course  I have reviewed the triage vital signs and the nursing notes.  Pertinent labs & imaging results that were available during my care of the patient were reviewed by me and considered in my medical decision making (see chart for details).    Take full dose of abx with food  Take the pain meds only for 2 days stay hydrated well  May need to see your pcp in 1 week to recheck urine if you still have symptoms  We will send off a urine culture if anything returns they will  Call you  Final Clinical Impressions(s) / UC Diagnoses   Final diagnoses:  Lower urinary tract infectious disease  Frequency of urination  Acute bilateral low back pain without sciatica     Discharge Instructions     Take full dose of abx with food  Take the pain meds only for 2 days stay hydrated well  May need to see your pcp in 1 week to recheck urine if you still have symptoms  We will send off a urine culture if anything returns they will  Call you     ED Prescriptions    Medication Sig Dispense Auth. Provider   cephALEXin (KEFLEX) 500 MG capsule Take 1 capsule (500 mg total) by mouth 4 (four) times daily. 20 capsule Marney Setting, NP  phenazopyridine (PYRIDIUM) 100 MG tablet Take 1 tablet (100 mg total) by mouth 3 (three) times daily as needed for pain. 10 tablet Marney Setting, NP     PDMP not reviewed this encounter.   Marney Setting, NP 12/03/19 1341

## 2019-12-03 NOTE — ED Triage Notes (Signed)
Pt c/o burning with urination, increased urinary frequency, urgency x3 days. Denies hematuria. Also c/o bilat back/flank pain x 2 days. Also states nausea and chills since last night. Denies vomiting.

## 2019-12-03 NOTE — Discharge Instructions (Addendum)
Take full dose of abx with food  Take the pain meds only for 2 days stay hydrated well  May need to see your pcp in 1 week to recheck urine if you still have symptoms  We will send off a urine culture if anything returns they will  Call you

## 2019-12-05 LAB — URINE CULTURE: Culture: 20000 — AB

## 2019-12-18 ENCOUNTER — Ambulatory Visit (HOSPITAL_COMMUNITY)
Admission: EM | Admit: 2019-12-18 | Discharge: 2019-12-18 | Disposition: A | Discharge status: Home / Self Care | Payer: 59 | Attending: Family Medicine | Admitting: Family Medicine

## 2019-12-18 ENCOUNTER — Other Ambulatory Visit: Payer: Self-pay

## 2019-12-18 ENCOUNTER — Encounter (HOSPITAL_COMMUNITY): Payer: Self-pay

## 2019-12-18 DIAGNOSIS — Z20822 Contact with and (suspected) exposure to covid-19: Secondary | ICD-10-CM | POA: Insufficient documentation

## 2019-12-18 DIAGNOSIS — J069 Acute upper respiratory infection, unspecified: Secondary | ICD-10-CM | POA: Insufficient documentation

## 2019-12-18 NOTE — Discharge Instructions (Addendum)
Go home to rest Drink plenty of fluids Take Tylenol for pain or fever You may take over-the-counter cough and cold medicines as needed You must quarantine at home until your test result is available You can check for your test result in MyChart  

## 2019-12-18 NOTE — ED Provider Notes (Signed)
[facility]    CSN: [id number] Arrival date & time: 12/18/19  1650      History   Chief Complaint Chief Complaint  Patient presents with  . Headache  . Nasal Congestion    HPI Sonia Jackson is a 64 y.o. female.   HPI  Patient has nasal congestion headache and sinus pressure since yesterday.  She took Tylenol without relief.  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  She is here today for evaluation Covid testing.  She is unable to work if she is sick.  She has been wearing her mask.  She has been social distancing.  She has not yet had a Covid vaccination.  Past Medical History:  Diagnosis Date  . Carpal tunnel syndrome   . Hard of hearing   . High cholesterol    patient denies  . History of kidney stones   . Hypertension   . PONV (postoperative nausea and vomiting)    nausea only  . Renal disorder     Patient Active Problem List   Diagnosis Date Noted  . Sclerosing adenosis of breast, right 10/01/2016    Past Surgical History:  Procedure Laterality Date  . ABDOMINAL HYSTERECTOMY    . APPENDECTOMY    . arm surgery Left   . BREAST EXCISIONAL BIOPSY Right 2017   benign  . BREAST LUMPECTOMY WITH RADIOACTIVE SEED LOCALIZATION Right 08/27/2016   Procedure: RIGHT BREAST LUMPECTOMY WITH RADIOACTIVE SEED LOCALIZATION;  Surgeon: Autumn Messing III, MD;  Location: [facility];  Service: General;  Laterality: Right;  . CARPAL TUNNEL RELEASE Left   . CHOLECYSTECTOMY    . COLONOSCOPY    . ESOPHAGOGASTRODUODENOSCOPY    . FOOT SURGERY Bilateral   . KNEE SURGERY Left     OB History   No obstetric history on file.      Home Medications    Prior to Admission medications   Medication Sig Start Date End Date Taking? Authorizing Provider  ALPRAZolam Duanne Moron) 0.5 MG tablet Take 0.5 mg by mouth at bedtime as needed for sleep.    [provider]  hydrochlorothiazide 25 MG tablet Take 25 mg by mouth every morning.      [provider]    Family History Family History  Problem Relation Age of Onset  . Diabetes Father   . Breast cancer Neg Hx     Social History Social History   Tobacco Use  . Smoking status: Never Smoker  . Smokeless tobacco: Never Used  Substance Use Topics  . Alcohol use: No  . Drug use: No     Allergies   Compazine and Compazine  [prochlorperazine]   Review of Systems Review of Systems  Constitutional: Negative for chills, fatigue and fever.  HENT: Positive for congestion, rhinorrhea, sinus pressure and sore throat.   Respiratory: Negative for cough and shortness of breath.   Gastrointestinal: Negative for nausea.  Neurological: Positive for headaches.     Physical Exam Triage Vital Signs ED Triage Vitals  Enc Vitals Group     BP 12/18/19 1746 (!) 148/54     Pulse Rate 12/18/19 1746 67     Resp 12/18/19 1746 (!) 22     Temp 12/18/19 1746 98.6 F (37 C)     Temp Source 12/18/19 1746 Oral     SpO2 12/18/19 1746 97 %     Weight --      Height --  Head Circumference --      Peak Flow --      Pain Score 12/18/19 1744 9     Pain Loc --      Pain Edu? --      Excl. in [place]? --    No data found.  Updated Vital Signs BP (!) 148/54 (BP Location: Left Arm)   Pulse 67   Temp 98.6 F (37 C) (Oral)   Resp (!) 22   SpO2 97%       Physical Exam Constitutional:      General: She is not in acute distress.    Appearance: She is well-developed.  HENT:     Head: Normocephalic and atraumatic.     Right Ear: Tympanic membrane, ear canal and external ear normal.     Left Ear: Ear canal and external ear normal.     Nose: Rhinorrhea present.     Mouth/Throat:     Pharynx: Posterior oropharyngeal erythema present.     Comments: No sinus tenderness Eyes:     Conjunctiva/sclera: Conjunctivae normal.     Pupils: Pupils are equal, round, and reactive to light.  Cardiovascular:     Rate and Rhythm: Normal rate and regular rhythm.     Heart sounds: Normal heart  sounds.  Pulmonary:     Effort: Pulmonary effort is normal. No respiratory distress.     Breath sounds: Normal breath sounds.  Musculoskeletal:        General: Normal range of motion.     Cervical back: Normal range of motion.  Lymphadenopathy:     Cervical: Cervical adenopathy present.  Skin:    General: Skin is warm and dry.  Neurological:     Mental Status: She is alert.  Psychiatric:        Mood and Affect: Mood normal.        Behavior: Behavior normal.      UC Treatments / Results  Labs (all labs ordered are listed, but only abnormal results are displayed) Labs Reviewed  NOVEL CORONAVIRUS, NAA (HOSP ORDER, SEND-OUT TO REF LAB; TAT 18-24 HRS)    EKG   Radiology No results found.  Procedures Procedures (including critical care time)  Medications Ordered in UC Medications - No data to display  Initial Impression / Assessment and Plan / UC Course  I have reviewed the triage vital signs and the nursing notes.  Pertinent labs & imaging results that were available during my care of the patient were reviewed by me and considered in my medical decision making (see chart for details).     Patient likely has a viral URI.  We will test for coronavirus and keep her out of work until this test is available.  Importance of quarantine until test results are available was reviewed Final Clinical Impressions(s) / UC Diagnoses   Final diagnoses:  Viral upper respiratory tract infection  Encounter for laboratory testing for COVID-19 virus     Discharge Instructions     Go home to rest Drink plenty of fluids Take Tylenol for pain or fever You may take over-the-counter cough and cold medicines as needed You must quarantine at home until your test result is available You can check for your test result in MyChart    ED Prescriptions    None     PDMP not reviewed this encounter.   Raylene Everts, MD 12/18/19 [phone]

## 2019-12-18 NOTE — ED Triage Notes (Signed)
Pt reports having nasal congestion, headache and sinus pressure x 1 day. Pt took Tylenol without relief.

## 2019-12-20 ENCOUNTER — Telehealth: Payer: Self-pay

## 2019-12-20 LAB — NOVEL CORONAVIRUS, NAA (HOSP ORDER, SEND-OUT TO REF LAB; TAT 18-24 HRS): SARS-CoV-2, NAA: NOT DETECTED

## 2019-12-20 NOTE — Telephone Encounter (Signed)
Pt notified of negative COVID-19 results. Understanding verbalized.  Sonia Jackson   

## 2020-02-16 ENCOUNTER — Encounter: Payer: Self-pay | Admitting: Gastroenterology

## 2020-03-21 ENCOUNTER — Other Ambulatory Visit: Payer: Self-pay | Admitting: Internal Medicine

## 2020-03-21 DIAGNOSIS — Z1231 Encounter for screening mammogram for malignant neoplasm of breast: Secondary | ICD-10-CM

## 2020-03-22 ENCOUNTER — Other Ambulatory Visit: Payer: Self-pay

## 2020-03-22 ENCOUNTER — Ambulatory Visit (AMBULATORY_SURGERY_CENTER): Payer: Self-pay | Admitting: *Deleted

## 2020-03-22 VITALS — Ht 59.0 in | Wt 150.2 lb

## 2020-03-22 DIAGNOSIS — Z1211 Encounter for screening for malignant neoplasm of colon: Secondary | ICD-10-CM

## 2020-03-22 MED ORDER — NA SULFATE-K SULFATE-MG SULF 17.5-3.13-1.6 GM/177ML PO SOLN
1.0000 | Freq: Once | ORAL | 0 refills | Status: AC
Start: 2020-03-22 — End: 2020-03-22

## 2020-03-22 NOTE — Progress Notes (Signed)

## 2020-04-05 ENCOUNTER — Other Ambulatory Visit: Payer: Self-pay

## 2020-04-05 ENCOUNTER — Encounter: Payer: Self-pay | Admitting: Gastroenterology

## 2020-04-05 ENCOUNTER — Ambulatory Visit (AMBULATORY_SURGERY_CENTER): Payer: 59 | Admitting: Gastroenterology

## 2020-04-05 VITALS — BP 136/69 | HR 73 | Temp 97.5°F | Resp 18 | Ht 59.0 in | Wt 150.2 lb

## 2020-04-05 DIAGNOSIS — Z1211 Encounter for screening for malignant neoplasm of colon: Secondary | ICD-10-CM

## 2020-04-05 DIAGNOSIS — D124 Benign neoplasm of descending colon: Secondary | ICD-10-CM | POA: Diagnosis not present

## 2020-04-05 DIAGNOSIS — D129 Benign neoplasm of anus and anal canal: Secondary | ICD-10-CM

## 2020-04-05 DIAGNOSIS — D128 Benign neoplasm of rectum: Secondary | ICD-10-CM

## 2020-04-05 MED ORDER — SODIUM CHLORIDE 0.9 % IV SOLN: 500.0000 mL | Freq: Once | INTRAVENOUS | Status: DC

## 2020-04-05 NOTE — Progress Notes (Signed)
Called to room to assist during endoscopic procedure.  Patient ID and intended procedure confirmed with present staff. Received instructions for my participation in the procedure from the performing physician.  

## 2020-04-05 NOTE — Progress Notes (Signed)
[facility]  2nd dose of covid vaccine 02-13-20  Pt's states no medical or surgical changes since previsit or office visit.

## 2020-04-05 NOTE — Patient Instructions (Signed)
You may have some rectal bleeding due to the polyp removal in your rectum.  If you see a lot of blood with clots in the toilet, we want you to call us.  Please read all of the handouts given to you by your recovery room nurse.  Thank-you for choosing [place] for your healthcare needs today.   YOU HAD AN ENDOSCOPIC PROCEDURE TODAY AT [facility]:   Refer to the procedure report that was given to you for any specific questions about what was found during the examination.  If the procedure report does not answer your questions, please call your gastroenterologist to clarify.  If you requested that your care partner not be given the details of your procedure findings, then the procedure report has been included in a sealed envelope for you to review at your convenience later.  YOU SHOULD EXPECT: Some feelings of bloating in the abdomen. Passage of more gas than usual.  Walking can help get rid of the air that was put into your GI tract during the procedure and reduce the bloating. If you had a lower endoscopy (such as a colonoscopy or flexible sigmoidoscopy) you may notice spotting of blood in your stool or on the toilet paper. If you underwent a bowel prep for your procedure, you may not have a normal bowel movement for a few days.  Please Note:  You might notice some irritation and congestion in your nose or some drainage.  This is from the oxygen used during your procedure.  There is no need for concern and it should clear up in a day or so.  SYMPTOMS TO REPORT IMMEDIATELY:   Following lower endoscopy (colonoscopy or flexible sigmoidoscopy):  Excessive amounts of blood in the stool  Significant tenderness or worsening of abdominal pains  Swelling of the abdomen that is new, acute  Fever of 100F or higher   For urgent or emergent issues, a gastroenterologist can be reached at any hour by calling [phone]. Do not use MyChart messaging for urgent concerns.    DIET:  We do  recommend a small meal at first, but then you may proceed to your regular diet.  Drink plenty of fluids but you should avoid alcoholic beverages for 24 hours.  Try to increase the fiber in your diet, and drink plenty of water.  Limit red meat and alcohol.  ACTIVITY:  You should plan to take it easy for the rest of today and you should NOT DRIVE or use heavy machinery until tomorrow (because of the sedation medicines used during the test).    FOLLOW UP: Our staff will call the number listed on your records 48-72 hours following your procedure to check on you and address any questions or concerns that you may have regarding the information given to you following your procedure. If we do not reach you, we will leave a message.  We will attempt to reach you two times.  During this call, we will ask if you have developed any symptoms of COVID 19. If you develop any symptoms (ie: fever, flu-like symptoms, shortness of breath, cough etc.) before then, please call [phone].  If you test positive for Covid 19 in the 2 weeks post procedure, please call and report this information to [place].    If any biopsies were taken you will be contacted by phone or by letter within the next 1-3 weeks.  Please call us at [phone] if you have not heard about the biopsies in  3 weeks.    SIGNATURES/CONFIDENTIALITY: You and/or your care partner have signed paperwork which will be entered into your electronic medical record.  These signatures attest to the fact that that the information above on your After Visit Summary has been reviewed and is understood.  Full responsibility of the confidentiality of this discharge information lies with you and/or your care-partner.

## 2020-04-05 NOTE — Op Note (Signed)
[place] Patient Name: Sonia Jackson Procedure Date: 04/05/2020 9:26 AM MRN: [id number] Endoscopist: Thornton Park MD, MD Age: 64 Referring MD:  Date of Birth: 05-26-1956 Gender: Female Account #: [id number] Procedure:                Colonoscopy Indications:              Screening for colorectal malignant neoplasm, This                            is the patient's first colonoscopy                           No known family history of colon cancer or polyps Medicines:                Monitored Anesthesia Care Procedure:                Pre-Anesthesia Assessment:                           - Prior to the procedure, a History and Physical                            was performed, and patient medications and                            allergies were reviewed. The patient's tolerance of                            previous anesthesia was also reviewed. The risks                            and benefits of the procedure and the sedation                            options and risks were discussed with the patient.                            All questions were answered, and informed consent                            was obtained. Prior Anticoagulants: The patient has                            taken no previous anticoagulant or antiplatelet                            agents. ASA Grade Assessment: II - A patient with                            mild systemic disease. After reviewing the risks                            and benefits, the patient was deemed in  satisfactory condition to undergo the procedure.                           After obtaining informed consent, the colonoscope                            was passed under direct vision. Throughout the                            procedure, the patient's blood pressure, pulse, and                            oxygen saturations were monitored continuously. The                            Colonoscope was  introduced through the anus and                            advanced to the 3 cm into the ileum. The                            colonoscopy was performed without difficulty. The                            patient tolerated the procedure well. The quality                            of the bowel preparation was good. The terminal                            ileum, ileocecal valve, appendiceal orifice, and                            rectum were photographed. Scope In: 9:38:34 AM Scope Out: 9:55:02 AM Scope Withdrawal Time: 0 hours 14 minutes 2 seconds  Total Procedure Duration: 0 hours 16 minutes 28 seconds  Findings:                 The perianal and digital rectal examinations were                            normal.                           A 12 mm polyp was found in the rectum. The polyp                            was sessile. The polyp was removed with a cold                            snare. Resection and retrieval were complete.                            Estimated blood loss was minimal.  A 3 mm polyp was found in the descending colon. The                            polyp was sessile. The polyp was removed with a                            cold snare. Resection and retrieval were complete.                            Estimated blood loss was minimal.                           The entire examined colon appeared normal on direct                            and retroflexion views.                           The terminal ileum appeared normal. Complications:            No immediate complications. Estimated blood loss:                            Minimal. Estimated Blood Loss:     Estimated blood loss was minimal. Impression:               - One 12 mm polyp in the rectum, removed with a                            cold snare. Resected and retrieved.                           - One 3 mm polyp in the descending colon, removed                            with a cold snare.  Resected and retrieved.                           - The entire examined colon is normal on direct and                            retroflexion views.                           - The examined portion of the ileum was normal. Recommendation:           - Patient has a contact number available for                            emergencies. The signs and symptoms of potential                            delayed complications were discussed with the  patient. Return to normal activities tomorrow.                            Written discharge instructions were provided to the                            patient.                           - Resume previous diet.                           - Continue present medications.                           - Await pathology results.                           - Repeat colonoscopy date to be determined after                            pending pathology results are reviewed for                            surveillance.                           - Emerging evidence supports eating a diet of                            fruits, vegetables, grains, calcium, and yogurt                            while reducing red meat and alcohol may reduce the                            risk of colon cancer.                           - Thank you for allowing me to be involved in your                            colon cancer prevention. Thornton Park MD, MD 04/05/2020 10:00:14 AM This report has been signed electronically.

## 2020-04-05 NOTE — Progress Notes (Signed)
To PACU< VSS. Report to [email or web address] 

## 2020-04-09 ENCOUNTER — Telehealth: Payer: Self-pay

## 2020-04-09 NOTE — Telephone Encounter (Signed)
  Follow up Call-  Call back number 04/05/2020  Post procedure Call Back phone  # [phone]  Permission to leave phone message Yes  Some recent data might be hidden     Patient questions:  Do you have a fever, pain , or abdominal swelling? No. Pain Score  0 *  Have you tolerated food without any problems? Yes.    Have you been able to return to your normal activities? Yes.    Do you have any questions about your discharge instructions: Diet   No. Medications  No. Follow up visit  No.  Do you have questions or concerns about your Care? No.  Actions: * If pain score is 4 or above: No action needed, pain <4. 1. Have you developed a fever since your procedure? no  2.   Have you had an respiratory symptoms (SOB or cough) since your procedure? no  3.   Have you tested positive for COVID 19 since your procedure no  4.   Have you had any family members/close contacts diagnosed with the COVID 19 since your procedure?  no   If yes to any of these questions please route to Joylene John, RN and Erenest Rasher, RN

## 2020-04-12 ENCOUNTER — Encounter: Payer: Self-pay | Admitting: Gastroenterology

## 2020-05-12 ENCOUNTER — Ambulatory Visit (HOSPITAL_COMMUNITY)
Admission: EM | Admit: 2020-05-12 | Discharge: 2020-05-12 | Disposition: A | Discharge status: Home / Self Care | Payer: 59 | Attending: Family Medicine | Admitting: Family Medicine

## 2020-05-12 ENCOUNTER — Encounter (HOSPITAL_COMMUNITY): Payer: Self-pay

## 2020-05-12 DIAGNOSIS — R300 Dysuria: Secondary | ICD-10-CM | POA: Insufficient documentation

## 2020-05-12 DIAGNOSIS — M545 Low back pain, unspecified: Secondary | ICD-10-CM

## 2020-05-12 DIAGNOSIS — R350 Frequency of micturition: Secondary | ICD-10-CM

## 2020-05-12 LAB — POCT URINALYSIS DIP (DEVICE)
Bilirubin Urine: NEGATIVE
Glucose, UA: NEGATIVE mg/dL
Ketones, ur: NEGATIVE mg/dL
Nitrite: NEGATIVE
Protein, ur: NEGATIVE mg/dL
Specific Gravity, Urine: >=1.03 (ref 1.005–1.030)
Urobilinogen, UA: 0.2 mg/dL (ref 0.0–1.0)
pH: 5 (ref 5.0–8.0)

## 2020-05-12 MED ORDER — KETOROLAC TROMETHAMINE 30 MG/ML IJ SOLN
INTRAMUSCULAR | Status: AC
  Filled 2020-05-12: qty 1

## 2020-05-12 MED ORDER — KETOROLAC TROMETHAMINE 30 MG/ML IJ SOLN
30.0000 mg | Freq: Once | INTRAMUSCULAR | Status: AC
  Administered 2020-05-12: 30 mg via INTRAMUSCULAR

## 2020-05-12 MED ORDER — IBUPROFEN 600 MG PO TABS
600.0000 mg | ORAL_TABLET | Freq: Four times a day (QID) | ORAL | 0 refills | Status: DC | PRN
Start: 2020-05-12 — End: 2021-11-17

## 2020-05-12 MED ORDER — PHENAZOPYRIDINE HCL 200 MG PO TABS
200.0000 mg | ORAL_TABLET | Freq: Three times a day (TID) | ORAL | 0 refills | Status: DC
Start: 2020-05-12 — End: 2020-06-23

## 2020-05-12 NOTE — ED Triage Notes (Signed)
Pt presents with lower back pain and increased urinary frequency x 2 days. Pt has not tried any medications for the complaints.

## 2020-05-12 NOTE — Discharge Instructions (Addendum)
Your urine was negative for a UTI. Please continue to drink plenty of water. Take Pyridium for symptoms relief and ibuprofen for back pain. If your culture returns positive for a UTI you will be called in an Antibiotic

## 2020-05-12 NOTE — ED Provider Notes (Signed)
[place]    CSN: [id number] Arrival date & time: 05/12/20  1005      History   Chief Complaint Chief Complaint  Patient presents with  . Urinary Frequency  . Back Pain    HPI Sonia Jackson is a 64 y.o. female. She presents with 2 days of dysuria, frequency and back pain. Denies abdominal pain. Feels like previous UTI she had years ago. She has not tried any medications   HPI  Past Medical History:  Diagnosis Date  . Carpal tunnel syndrome   . Hard of hearing   . History of kidney stones   . PONV (postoperative nausea and vomiting)    nausea only    Patient Active Problem List   Diagnosis Date Noted  . Sclerosing adenosis of breast, right 10/01/2016    Past Surgical History:  Procedure Laterality Date  . ABDOMINAL HYSTERECTOMY    . APPENDECTOMY    . arm surgery Left   . BREAST EXCISIONAL BIOPSY Right 2017   benign  . BREAST LUMPECTOMY WITH RADIOACTIVE SEED LOCALIZATION Right 08/27/2016   Procedure: RIGHT BREAST LUMPECTOMY WITH RADIOACTIVE SEED LOCALIZATION;  Surgeon: Autumn Messing III, MD;  Location: [facility];  Service: General;  Laterality: Right;  . CARPAL TUNNEL RELEASE Left   . CHOLECYSTECTOMY    . COLONOSCOPY    . ESOPHAGOGASTRODUODENOSCOPY    . FOOT SURGERY Bilateral   . KNEE SURGERY Left     OB History   No obstetric history on file.      Home Medications    Prior to Admission medications   Medication Sig Start Date End Date Taking? Authorizing Provider  ALPRAZolam Duanne Moron) 0.5 MG tablet Take 0.5 mg by mouth at bedtime as needed for sleep.    [provider]  hydrochlorothiazide 25 MG tablet Take 25 mg by mouth every morning.      [provider]  ibuprofen (ADVIL) 600 MG tablet Take 1 tablet (600 mg total) by mouth every 6 (six) hours as needed. 05/12/20   Domingo Dimes, PA-C  methocarbamol (ROBAXIN) 500 MG tablet Take 500 mg by mouth daily as needed. 03/13/20   [provider]  phenazopyridine  (PYRIDIUM) 200 MG tablet Take 1 tablet (200 mg total) by mouth 3 (three) times daily. 05/12/20   Domingo Dimes, PA-C    Family History Family History  Problem Relation Age of Onset  . Diabetes Father   . Breast cancer Neg Hx   . Colon cancer Neg Hx   . Esophageal cancer Neg Hx   . Stomach cancer Neg Hx   . Rectal cancer Neg Hx     Social History Social History   Tobacco Use  . Smoking status: Never Smoker  . Smokeless tobacco: Never Used  Vaping Use  . Vaping Use: Never used  Substance Use Topics  . Alcohol use: No  . Drug use: No     Allergies   Compazine and Compazine  [prochlorperazine]   Review of Systems Review of Systems   Physical Exam Triage Vital Signs ED Triage Vitals  Enc Vitals Group     BP 05/12/20 1025 131/68     Pulse Rate 05/12/20 1025 75     Resp 05/12/20 1025 (!) 21     Temp 05/12/20 1025 97.9 F (36.6 C)     Temp Source 05/12/20 1025 Oral     SpO2 05/12/20 1025 98 %     Weight --      Height --  Head Circumference --      Peak Flow --      Pain Score 05/12/20 1026 10     Pain Loc --      Pain Edu? --      Excl. in [address]? --    No data found.  Updated Vital Signs BP 131/68 (BP Location: Left Arm)   Pulse 75   Temp 97.9 F (36.6 C) (Oral)   Resp (!) 21   SpO2 98%   Visual Acuity Right Eye Distance:   Left Eye Distance:   Bilateral Distance:    Right Eye Near:   Left Eye Near:    Bilateral Near:     Physical Exam   UC Treatments / Results  Labs (all labs ordered are listed, but only abnormal results are displayed) Labs Reviewed  POCT URINALYSIS DIP (DEVICE) - Abnormal; Notable for the following components:      Result Value   Hgb urine dipstick SMALL (*)    Leukocytes,Ua TRACE (*)    All other components within normal limits  URINE CULTURE    EKG   Radiology No results found.  Procedures Procedures (including critical care time)  Medications Ordered in UC Medications  ketorolac (TORADOL) 30 MG/ML  injection 30 mg (has no administration in time range)    Initial Impression / Assessment and Plan / UC Course  I have reviewed the triage vital signs and the nursing notes.  Pertinent labs & imaging results that were available during my care of the patient were reviewed by me and considered in my medical decision making (see chart for details).     Dysuria and urinary frequency Final Clinical Impressions(s) / UC Diagnoses   Final diagnoses:  Dysuria  Acute bilateral low back pain without sciatica     Discharge Instructions     Your urine was negative for a UTI. Please continue to drink plenty of water. Take Pyridium for symptoms relief and ibuprofen for back pain. If your culture returns positive for a UTI you will be called in an Antibiotic    ED Prescriptions    Medication Sig Dispense Auth. Provider   ibuprofen (ADVIL) 600 MG tablet Take 1 tablet (600 mg total) by mouth every 6 (six) hours as needed. 30 tablet Sheliah Mends S, PA-C   phenazopyridine (PYRIDIUM) 200 MG tablet Take 1 tablet (200 mg total) by mouth 3 (three) times daily. 6 tablet Domingo Dimes, [place]     PDMP not reviewed this encounter.   Domingo Dimes, PA-C 05/12/20 1052

## 2020-05-13 LAB — URINE CULTURE: Culture: NO GROWTH

## 2020-06-23 ENCOUNTER — Ambulatory Visit (HOSPITAL_COMMUNITY)
Admission: EM | Admit: 2020-06-23 | Discharge: 2020-06-23 | Disposition: A | Discharge status: Home / Self Care | Payer: 59 | Attending: Family Medicine | Admitting: Family Medicine

## 2020-06-23 ENCOUNTER — Encounter (HOSPITAL_COMMUNITY): Payer: Self-pay

## 2020-06-23 ENCOUNTER — Other Ambulatory Visit: Payer: Self-pay

## 2020-06-23 DIAGNOSIS — R3129 Other microscopic hematuria: Secondary | ICD-10-CM

## 2020-06-23 DIAGNOSIS — R300 Dysuria: Secondary | ICD-10-CM

## 2020-06-23 DIAGNOSIS — M545 Low back pain, unspecified: Secondary | ICD-10-CM

## 2020-06-23 LAB — POCT URINALYSIS DIPSTICK, ED / UC
Bilirubin Urine: NEGATIVE
Glucose, UA: NEGATIVE mg/dL
Ketones, ur: NEGATIVE mg/dL
Leukocytes,Ua: NEGATIVE
Nitrite: NEGATIVE
Protein, ur: NEGATIVE mg/dL
Specific Gravity, Urine: >=1.03 (ref 1.005–1.030)
Urobilinogen, UA: 0.2 mg/dL (ref 0.0–1.0)
pH: 5.5 (ref 5.0–8.0)

## 2020-06-23 MED ORDER — TRAMADOL HCL 50 MG PO TABS: 50.0000 mg | ORAL_TABLET | Freq: Four times a day (QID) | ORAL | 0 refills | Status: DC | PRN

## 2020-06-23 MED ORDER — CEPHALEXIN 500 MG PO CAPS
500.0000 mg | ORAL_CAPSULE | Freq: Two times a day (BID) | ORAL | 0 refills | Status: DC
Start: 2020-06-23 — End: 2020-08-06

## 2020-06-23 NOTE — ED Provider Notes (Signed)
[place]    CSN: [id number] Arrival date & time: 06/23/20  1239      History   Chief Complaint Chief Complaint  Patient presents with  . Back Pain    HPI Sonia Jackson is a 64 y.o. female.   HPI  Patient states that she has recurring UTI.  She has many years postmenopausal, had a hysterectomy at a young age.  Her last couple of urine test have been unremarkable with negative cultures.  I discussed with her that she should see an OB/GYN about possible atrophic vaginitis or prolapse causing her urinary frequency and symptoms.  She does have some hematuria today, however. Also complains of low back pain.  States the back pain is "severe".  Thinks this is in her "kidneys".  No fever chills.  No nausea or vomiting.  Past history of kidney stones.  Past Medical History:  Diagnosis Date  . Carpal tunnel syndrome   . Hard of hearing   . History of kidney stones   . PONV (postoperative nausea and vomiting)    nausea only    Patient Active Problem List   Diagnosis Date Noted  . Sclerosing adenosis of breast, right 10/01/2016    Past Surgical History:  Procedure Laterality Date  . ABDOMINAL HYSTERECTOMY    . APPENDECTOMY    . arm surgery Left   . BREAST EXCISIONAL BIOPSY Right 2017   benign  . BREAST LUMPECTOMY WITH RADIOACTIVE SEED LOCALIZATION Right 08/27/2016   Procedure: RIGHT BREAST LUMPECTOMY WITH RADIOACTIVE SEED LOCALIZATION;  Surgeon: Autumn Messing III, MD;  Location: [facility];  Service: General;  Laterality: Right;  . CARPAL TUNNEL RELEASE Left   . CHOLECYSTECTOMY    . COLONOSCOPY    . ESOPHAGOGASTRODUODENOSCOPY    . FOOT SURGERY Bilateral   . KNEE SURGERY Left     OB History   No obstetric history on file.      Home Medications    Prior to Admission medications   Medication Sig Start Date End Date Taking? Authorizing Provider  ALPRAZolam Duanne Moron) 0.5 MG tablet Take 0.5 mg by mouth at bedtime as needed for sleep.    [provider]    cephALEXin (KEFLEX) 500 MG capsule Take 1 capsule (500 mg total) by mouth 2 (two) times daily. 06/23/20   Raylene Everts, MD  hydrochlorothiazide 25 MG tablet Take 25 mg by mouth every morning.      [provider]  ibuprofen (ADVIL) 600 MG tablet Take 1 tablet (600 mg total) by mouth every 6 (six) hours as needed. 05/12/20   Domingo Dimes, PA-C  traMADol (ULTRAM) 50 MG tablet Take 1 tablet (50 mg total) by mouth every 6 (six) hours as needed. 06/23/20   Raylene Everts, MD    Family History Family History  Problem Relation Age of Onset  . Diabetes Father   . Healthy Mother   . Breast cancer Neg Hx   . Colon cancer Neg Hx   . Esophageal cancer Neg Hx   . Stomach cancer Neg Hx   . Rectal cancer Neg Hx     Social History Social History   Tobacco Use  . Smoking status: Never Smoker  . Smokeless tobacco: Never Used  Vaping Use  . Vaping Use: Never used  Substance Use Topics  . Alcohol use: No  . Drug use: No     Allergies   Compazine and Compazine  [prochlorperazine]   Review of Systems Review of Systems  See HPI  Physical Exam Triage Vital Signs ED Triage Vitals  Enc Vitals Group     BP 06/23/20 1355 (!) 152/61     Pulse Rate 06/23/20 1355 61     Resp 06/23/20 1355 19     Temp 06/23/20 1355 98.1 F (36.7 C)     Temp src --      SpO2 06/23/20 1355 100 %     Weight --      Height --      Head Circumference --      Peak Flow --      Pain Score 06/23/20 1353 10     Pain Loc --      Pain Edu? --      Excl. in [place]? --    No data found.  Updated Vital Signs BP (!) 152/61   Pulse 61   Temp 98.1 F (36.7 C)   Resp 19   SpO2 100%   Visual Acuity Right Eye Distance:   Left Eye Distance:   Bilateral Distance:    Right Eye Near:   Left Eye Near:    Bilateral Near:     Physical Exam Constitutional:      General: She is not in acute distress.    Appearance: Normal appearance. She is well-developed.  HENT:     Head: Normocephalic and  atraumatic.     Mouth/Throat:     Comments: Mask is in place Eyes:     Conjunctiva/sclera: Conjunctivae normal.     Pupils: Pupils are equal, round, and reactive to light.  Cardiovascular:     Rate and Rhythm: Normal rate.  Pulmonary:     Effort: Pulmonary effort is normal. No respiratory distress.  Abdominal:     General: There is no distension.     Palpations: Abdomen is soft.     Tenderness: There is no right CVA tenderness or left CVA tenderness.  Musculoskeletal:        General: Normal range of motion.     Cervical back: Normal range of motion.     Comments: Mild tenderness across the posterior pelvis.  No palpable muscle spasm.  Skin:    General: Skin is warm and dry.  Neurological:     General: No focal deficit present.     Mental Status: She is alert.  Psychiatric:        Behavior: Behavior normal.      UC Treatments / Results  Labs (all labs ordered are listed, but only abnormal results are displayed) Labs Reviewed  POCT URINALYSIS DIPSTICK, ED / UC - Abnormal; Notable for the following components:      Result Value   Hgb urine dipstick SMALL (*)    All other components within normal limits    EKG   Radiology No results found.  Procedures Procedures (including critical care time)  Medications Ordered in UC Medications - No data to display  Initial Impression / Assessment and Plan / UC Course  I have reviewed the triage vital signs and the nursing notes.  Pertinent labs & imaging results that were available during my care of the patient were reviewed by me and considered in my medical decision making (see chart for details).     Urine culture is pending.  Will treat for back pain and for cystitis.  Follow-up with PCP/GYN as recommended. Final Clinical Impressions(s) / UC Diagnoses   Final diagnoses:  Acute midline low back pain without sciatica  Dysuria  Microscopic hematuria  Discharge Instructions     Continue to drink plenty of  water Take antibiotic Keflex 2 times a day May stop the antibiotic early if the urine culture is negative May take Tylenol or ibuprofen for moderate pain Take tramadol if needed for severe pain Do not drive on the tramadol    ED Prescriptions    Medication Sig Dispense Auth. Provider   traMADol (ULTRAM) 50 MG tablet Take 1 tablet (50 mg total) by mouth every 6 (six) hours as needed. 15 tablet Raylene Everts, MD   cephALEXin (KEFLEX) 500 MG capsule Take 1 capsule (500 mg total) by mouth 2 (two) times daily. 10 capsule Raylene Everts, MD     I have reviewed the PDMP during this encounter.   Raylene Everts, MD 06/23/20 [phone]

## 2020-06-23 NOTE — ED Triage Notes (Signed)
Pt presents with complaints of lower back pain, lower abdominal pain, and dysuria x 3 days. Pt concerned for uti.

## 2020-06-23 NOTE — Discharge Instructions (Signed)
Continue to drink plenty of water Take antibiotic Keflex 2 times a day May stop the antibiotic early if the urine culture is negative May take Tylenol or ibuprofen for moderate pain Take tramadol if needed for severe pain Do not drive on the tramadol

## 2020-07-15 ENCOUNTER — Ambulatory Visit
Admission: RE | Admit: 2020-07-15 | Discharge: 2020-07-15 | Disposition: A | Discharge status: Home / Self Care | Payer: 59 | Source: Ambulatory Visit | Attending: Internal Medicine | Admitting: Internal Medicine

## 2020-07-15 ENCOUNTER — Other Ambulatory Visit: Payer: Self-pay

## 2020-07-15 DIAGNOSIS — Z1231 Encounter for screening mammogram for malignant neoplasm of breast: Secondary | ICD-10-CM

## 2020-08-06 ENCOUNTER — Encounter (HOSPITAL_COMMUNITY): Payer: Self-pay

## 2020-08-06 ENCOUNTER — Ambulatory Visit (HOSPITAL_COMMUNITY)
Admission: EM | Admit: 2020-08-06 | Discharge: 2020-08-06 | Disposition: A | Discharge status: Home / Self Care | Payer: 59 | Attending: Family Medicine | Admitting: Family Medicine

## 2020-08-06 ENCOUNTER — Other Ambulatory Visit: Payer: Self-pay

## 2020-08-06 DIAGNOSIS — R300 Dysuria: Secondary | ICD-10-CM | POA: Insufficient documentation

## 2020-08-06 DIAGNOSIS — N3090 Cystitis, unspecified without hematuria: Secondary | ICD-10-CM | POA: Insufficient documentation

## 2020-08-06 DIAGNOSIS — N3080 Other cystitis without hematuria: Secondary | ICD-10-CM

## 2020-08-06 LAB — POCT URINALYSIS DIPSTICK, ED / UC
Bilirubin Urine: NEGATIVE
Glucose, UA: NEGATIVE mg/dL
Ketones, ur: NEGATIVE mg/dL
Nitrite: NEGATIVE
Protein, ur: NEGATIVE mg/dL
Specific Gravity, Urine: >=1.03 (ref 1.005–1.030)
Urobilinogen, UA: 0.2 mg/dL (ref 0.0–1.0)
pH: 5.5 (ref 5.0–8.0)

## 2020-08-06 MED ORDER — CEPHALEXIN 500 MG PO CAPS
500.0000 mg | ORAL_CAPSULE | Freq: Two times a day (BID) | ORAL | 0 refills | Status: AC
Start: 2020-08-06 — End: 2020-08-11

## 2020-08-06 NOTE — ED Triage Notes (Signed)
Pt present urinary frequency with lower back pain. Symptoms started on Saturday. Pt states that she has a burning sensation while urinating.

## 2020-08-06 NOTE — ED Provider Notes (Signed)
[place]    CSN: [id number] Arrival date & time: 08/06/20  1651      History   Chief Complaint Chief Complaint  Patient presents with  . Urinary Frequency  . Back Pain    HPI Sonia Jackson is a 64 y.o. female.   Patient presents with dysuria, urinary frequency, lower back pain x4 days.  She denies fever, chills, abdominal pain, vaginal discharge, pelvic pain, or other symptoms.  No treatments attempted at home.  The history is provided by the patient.    Past Medical History:  Diagnosis Date  . Carpal tunnel syndrome   . Hard of hearing   . History of kidney stones   . PONV (postoperative nausea and vomiting)    nausea only    Patient Active Problem List   Diagnosis Date Noted  . Sclerosing adenosis of breast, right 10/01/2016    Past Surgical History:  Procedure Laterality Date  . ABDOMINAL HYSTERECTOMY    . APPENDECTOMY    . arm surgery Left   . BREAST EXCISIONAL BIOPSY Right 2017   benign  . BREAST LUMPECTOMY WITH RADIOACTIVE SEED LOCALIZATION Right 08/27/2016   Procedure: RIGHT BREAST LUMPECTOMY WITH RADIOACTIVE SEED LOCALIZATION;  Surgeon: Autumn Messing III, MD;  Location: [facility];  Service: General;  Laterality: Right;  . CARPAL TUNNEL RELEASE Left   . CHOLECYSTECTOMY    . COLONOSCOPY    . ESOPHAGOGASTRODUODENOSCOPY    . FOOT SURGERY Bilateral   . KNEE SURGERY Left     OB History   No obstetric history on file.      Home Medications    Prior to Admission medications   Medication Sig Start Date End Date Taking? Authorizing Provider  ALPRAZolam Duanne Moron) 0.5 MG tablet Take 0.5 mg by mouth at bedtime as needed for sleep.    [provider]  cephALEXin (KEFLEX) 500 MG capsule Take 1 capsule (500 mg total) by mouth 2 (two) times daily for 5 days. 08/06/20 08/11/20  Sharion Balloon, NP  hydrochlorothiazide 25 MG tablet Take 25 mg by mouth every morning.      [provider]  ibuprofen (ADVIL) 600 MG tablet Take 1 tablet  (600 mg total) by mouth every 6 (six) hours as needed. 05/12/20   Domingo Dimes, PA-C  traMADol (ULTRAM) 50 MG tablet Take 1 tablet (50 mg total) by mouth every 6 (six) hours as needed. 06/23/20   Raylene Everts, MD    Family History Family History  Problem Relation Age of Onset  . Diabetes Father   . Healthy Mother   . Breast cancer Neg Hx   . Colon cancer Neg Hx   . Esophageal cancer Neg Hx   . Stomach cancer Neg Hx   . Rectal cancer Neg Hx     Social History Social History   Tobacco Use  . Smoking status: Never Smoker  . Smokeless tobacco: Never Used  Vaping Use  . Vaping Use: Never used  Substance Use Topics  . Alcohol use: No  . Drug use: No     Allergies   Compazine and Compazine  [prochlorperazine]   Review of Systems Review of Systems  Constitutional: Negative for chills and fever.  HENT: Negative for ear pain and sore throat.   Eyes: Negative for pain and visual disturbance.  Respiratory: Negative for cough and shortness of breath.   Cardiovascular: Negative for chest pain and palpitations.  Gastrointestinal: Negative for abdominal pain and vomiting.  Genitourinary: Positive  for dysuria and frequency. Negative for hematuria, pelvic pain and vaginal discharge.  Musculoskeletal: Positive for back pain. Negative for arthralgias.  Skin: Negative for color change and rash.  Neurological: Negative for seizures and syncope.  All other systems reviewed and are negative.    Physical Exam Triage Vital Signs ED Triage Vitals [08/06/20 1811]  Enc Vitals Group     BP (!) 146/66     Pulse Rate 60     Resp 16     Temp 98 F (36.7 C)     Temp Source Oral     SpO2 100 %     Weight      Height      Head Circumference      Peak Flow      Pain Score 10     Pain Loc      Pain Edu?      Excl. in [place]?    No data found.  Updated Vital Signs BP (!) 146/66 (BP Location: Right Arm)   Pulse 60   Temp 98 F (36.7 C) (Oral)   Resp 16   SpO2 100%    Visual Acuity Right Eye Distance:   Left Eye Distance:   Bilateral Distance:    Right Eye Near:   Left Eye Near:    Bilateral Near:     Physical Exam Vitals and nursing note reviewed.  Constitutional:      General: She is not in acute distress.    Appearance: She is well-developed. She is not ill-appearing.  HENT:     Head: Normocephalic and atraumatic.  Eyes:     Conjunctiva/sclera: Conjunctivae normal.  Cardiovascular:     Rate and Rhythm: Normal rate and regular rhythm.     Heart sounds: No murmur heard.   Pulmonary:     Effort: Pulmonary effort is normal. No respiratory distress.     Breath sounds: Normal breath sounds.  Abdominal:     General: Bowel sounds are normal.     Palpations: Abdomen is soft.     Tenderness: There is no abdominal tenderness. There is no right CVA tenderness, left CVA tenderness, guarding or rebound.  Musculoskeletal:     Cervical back: Neck supple.  Skin:    General: Skin is warm and dry.     Findings: No rash.  Neurological:     General: No focal deficit present.     Mental Status: She is alert and oriented to person, place, and time.     Gait: Gait normal.  Psychiatric:        Mood and Affect: Mood normal.        Behavior: Behavior normal.      UC Treatments / Results  Labs (all labs ordered are listed, but only abnormal results are displayed) Labs Reviewed  POCT URINALYSIS DIPSTICK, ED / UC - Abnormal; Notable for the following components:      Result Value   Hgb urine dipstick SMALL (*)    Leukocytes,Ua SMALL (*)    All other components within normal limits  URINE CULTURE    EKG   Radiology No results found.  Procedures Procedures (including critical care time)  Medications Ordered in UC Medications - No data to display  Initial Impression / Assessment and Plan / UC Course  I have reviewed the triage vital signs and the nursing notes.  Pertinent labs & imaging results that were available during my care of  the patient were reviewed by me and considered in  my medical decision making (see chart for details).   Dysuria, cystitis.  Treating with Keflex.  Urine culture pending.  Discussed with patient that we will call her if the culture shows the need to change or discontinue her antibiotic.  Instructed her to follow-up with her PCP if her symptoms are not improving.  Work note provided per patient request.  Patient agrees to plan of care.   Final Clinical Impressions(s) / UC Diagnoses   Final diagnoses:  Dysuria  Cystitis     Discharge Instructions     Take the antibiotic as directed.  A urine culture is pending.  We will call you if it shows the need to change or discontinue your antibiotic.    Follow up with your primary care provider if your symptoms are not improving.       ED Prescriptions    Medication Sig Dispense Auth. Provider   cephALEXin (KEFLEX) 500 MG capsule Take 1 capsule (500 mg total) by mouth 2 (two) times daily for 5 days. 10 capsule Sharion Balloon, NP     PDMP not reviewed this encounter.   Sharion Balloon, NP 08/06/20 1839

## 2020-08-06 NOTE — Discharge Instructions (Signed)
Take the antibiotic as directed.  A urine culture is pending.  We will call you if it shows the need to change or discontinue your antibiotic.    Follow up with your primary care provider if your symptoms are not improving.

## 2020-08-07 LAB — URINE CULTURE

## 2021-06-05 ENCOUNTER — Other Ambulatory Visit: Payer: Self-pay | Admitting: Internal Medicine

## 2021-06-05 DIAGNOSIS — Z1231 Encounter for screening mammogram for malignant neoplasm of breast: Secondary | ICD-10-CM

## 2021-07-25 ENCOUNTER — Other Ambulatory Visit: Payer: Self-pay

## 2021-07-25 ENCOUNTER — Ambulatory Visit
Admission: RE | Admit: 2021-07-25 | Discharge: 2021-07-25 | Disposition: A | Discharge status: Home / Self Care | Payer: 59 | Source: Ambulatory Visit | Attending: Internal Medicine | Admitting: Internal Medicine

## 2021-07-25 DIAGNOSIS — Z1231 Encounter for screening mammogram for malignant neoplasm of breast: Secondary | ICD-10-CM

## 2021-09-22 ENCOUNTER — Ambulatory Visit (HOSPITAL_COMMUNITY)
Admission: EM | Admit: 2021-09-22 | Discharge: 2021-09-22 | Disposition: A | Discharge status: Home / Self Care | Payer: 59 | Attending: Emergency Medicine | Admitting: Emergency Medicine

## 2021-09-22 ENCOUNTER — Other Ambulatory Visit: Payer: Self-pay

## 2021-09-22 ENCOUNTER — Encounter (HOSPITAL_COMMUNITY): Payer: Self-pay

## 2021-09-22 DIAGNOSIS — R110 Nausea: Secondary | ICD-10-CM

## 2021-09-22 DIAGNOSIS — R197 Diarrhea, unspecified: Secondary | ICD-10-CM

## 2021-09-22 DIAGNOSIS — J00 Acute nasopharyngitis [common cold]: Secondary | ICD-10-CM

## 2021-09-22 MED ORDER — PREDNISONE 10 MG (21) PO TBPK
ORAL_TABLET | Freq: Every day | ORAL | 0 refills | Status: DC
Start: 2021-09-22 — End: 2021-11-17

## 2021-09-22 MED ORDER — ONDANSETRON HCL 4 MG PO TABS: 4.0000 mg | ORAL_TABLET | Freq: Four times a day (QID) | ORAL | 0 refills | Status: DC

## 2021-09-22 MED ORDER — FLUTICASONE PROPIONATE 50 MCG/ACT NA SUSP
1.0000 | Freq: Every day | NASAL | 2 refills | Status: DC
Start: 2021-09-22 — End: 2024-09-19

## 2021-09-22 NOTE — ED Triage Notes (Signed)
Pt presents with congestion, nasal drainage, cough, and diarrhea since yesterday.

## 2021-09-22 NOTE — ED Provider Notes (Signed)
[facility]    CSN: [id number] Arrival date & time: 09/22/21  3557      History   Chief Complaint Chief Complaint  Patient presents with   URI   Diarrhea    HPI Sonia Jackson is a 65 y.o. female.   Cough, congestion, nausea with some diarrhea since sat. Pt denies any fever, slight cough. Took at home covid test was negative. Pt is a Actuary for an elderly pt . Has not taken anything pta. Denies any chest pain no sob.    Past Medical History:  Diagnosis Date   Carpal tunnel syndrome    Hard of hearing    History of kidney stones    PONV (postoperative nausea and vomiting)    nausea only    Patient Active Problem List   Diagnosis Date Noted   Sclerosing adenosis of breast, right 10/01/2016    Past Surgical History:  Procedure Laterality Date   ABDOMINAL HYSTERECTOMY     APPENDECTOMY     arm surgery Left    BREAST EXCISIONAL BIOPSY Right 2017   benign   BREAST LUMPECTOMY WITH RADIOACTIVE SEED LOCALIZATION Right 08/27/2016   Procedure: RIGHT BREAST LUMPECTOMY WITH RADIOACTIVE SEED LOCALIZATION;  Surgeon: Autumn Messing III, MD;  Location: [facility];  Service: General;  Laterality: Right;   CARPAL TUNNEL RELEASE Left    CHOLECYSTECTOMY     COLONOSCOPY     ESOPHAGOGASTRODUODENOSCOPY     FOOT SURGERY Bilateral    KNEE SURGERY Left     OB History   No obstetric history on file.      Home Medications    Prior to Admission medications   Medication Sig Start Date End Date Taking? Authorizing Provider  fluticasone (FLONASE) 50 MCG/ACT nasal spray Place 1 spray into both nostrils daily. 09/22/21  Yes Marney Setting, NP  ondansetron (ZOFRAN) 4 MG tablet Take 1 tablet (4 mg total) by mouth every 6 (six) hours. 09/22/21  Yes Marney Setting, NP  predniSONE (STERAPRED UNI-PAK 21 TAB) 10 MG (21) TBPK tablet Take by mouth daily. Take 6 tabs by mouth daily  for 2 days, then 5 tabs for 2 days, then 4 tabs for 2 days, then 3 tabs for 2 days, 2 tabs for 2  days, then 1 tab by mouth daily for 2 days 09/22/21  Yes Marney Setting, NP  ALPRAZolam Duanne Moron) 0.5 MG tablet Take 0.5 mg by mouth at bedtime as needed for sleep.    [provider]  hydrochlorothiazide 25 MG tablet Take 25 mg by mouth every morning.      [provider]  ibuprofen (ADVIL) 600 MG tablet Take 1 tablet (600 mg total) by mouth every 6 (six) hours as needed. 05/12/20   Domingo Dimes, PA-C  traMADol (ULTRAM) 50 MG tablet Take 1 tablet (50 mg total) by mouth every 6 (six) hours as needed. 06/23/20   Raylene Everts, MD    Family History Family History  Problem Relation Age of Onset   Diabetes Father    Healthy Mother    Breast cancer Neg Hx    Colon cancer Neg Hx    Esophageal cancer Neg Hx    Stomach cancer Neg Hx    Rectal cancer Neg Hx     Social History Social History   Tobacco Use   Smoking status: Never   Smokeless tobacco: Never  Vaping Use   Vaping Use: Never used  Substance Use Topics   Alcohol use:  No   Drug use: No     Allergies   Compazine and Compazine  [prochlorperazine]   Review of Systems Review of Systems  Constitutional:  Negative for fatigue and fever.  HENT:  Positive for congestion, postnasal drip, rhinorrhea, sinus pressure, sinus pain, sneezing and sore throat.   Eyes: Negative.   Respiratory:  Positive for cough.   Cardiovascular: Negative.   Gastrointestinal:  Positive for diarrhea and nausea.  Genitourinary: Negative.   Musculoskeletal: Negative.   Skin: Negative.   Neurological: Negative.     Physical Exam Triage Vital Signs ED Triage Vitals  Enc Vitals Group     BP 09/22/21 0902 (!) 150/79     Pulse Rate 09/22/21 0902 62     Resp 09/22/21 0902 17     Temp 09/22/21 0902 98.2 F (36.8 C)     Temp Source 09/22/21 0902 Oral     SpO2 09/22/21 0902 98 %     Weight --      Height --      Head Circumference --      Peak Flow --      Pain Score 09/22/21 0907 2     Pain Loc --      Pain Edu?  --      Excl. in [place]? --    No data found.  Updated Vital Signs BP (!) 150/79 (BP Location: Left Arm)   Pulse 62   Temp 98.2 F (36.8 C) (Oral)   Resp 17   SpO2 98%   Visual Acuity Right Eye Distance:   Left Eye Distance:   Bilateral Distance:    Right Eye Near:   Left Eye Near:    Bilateral Near:     Physical Exam Constitutional:      Appearance: Normal appearance. She is obese.  HENT:     Right Ear: Tympanic membrane normal.     Left Ear: Tympanic membrane normal.     Nose: Congestion and rhinorrhea present.     Mouth/Throat:     Mouth: Mucous membranes are moist.  Eyes:     Pupils: Pupils are equal, round, and reactive to light.  Cardiovascular:     Rate and Rhythm: Normal rate.  Pulmonary:     Effort: Pulmonary effort is normal.  Abdominal:     General: Abdomen is flat.  Musculoskeletal:        General: Normal range of motion.  Skin:    General: Skin is warm.  Neurological:     General: No focal deficit present.     Mental Status: She is alert.     UC Treatments / Results  Labs (all labs ordered are listed, but only abnormal results are displayed) Labs Reviewed - No data to display  EKG   Radiology No results found.  Procedures Procedures (including critical care time)  Medications Ordered in UC Medications - No data to display  Initial Impression / Assessment and Plan / UC Course  I have reviewed the triage vital signs and the nursing notes.  Pertinent labs & imaging results that were available during my care of the patient were reviewed by me and considered in my medical decision making (see chart for details).     Y our sx are more so a common cold  Take otc meds for cough and cold avoid any decongestants  Stay hydrated well  Take tylenol or motrin as needed for pain  Go to er if symptoms become worse  Final Clinical Impressions(s) /  UC Diagnoses   Final diagnoses:  Diarrhea, unspecified type  Nausea  Common cold virus    Discharge Instructions   None    ED Prescriptions     Medication Sig Dispense Auth. Provider   fluticasone (FLONASE) 50 MCG/ACT nasal spray Place 1 spray into both nostrils daily. 16 g Morley Kos L, NP   predniSONE (STERAPRED UNI-PAK 21 TAB) 10 MG (21) TBPK tablet Take by mouth daily. Take 6 tabs by mouth daily  for 2 days, then 5 tabs for 2 days, then 4 tabs for 2 days, then 3 tabs for 2 days, 2 tabs for 2 days, then 1 tab by mouth daily for 2 days 42 tablet Morley Kos L, NP   ondansetron (ZOFRAN) 4 MG tablet Take 1 tablet (4 mg total) by mouth every 6 (six) hours. 12 tablet Marney Setting, NP      PDMP not reviewed this encounter.   Marney Setting, NP 09/22/21 [phone]

## 2021-10-25 ENCOUNTER — Emergency Department (HOSPITAL_COMMUNITY): Payer: 59

## 2021-10-25 ENCOUNTER — Emergency Department (HOSPITAL_COMMUNITY)
Admission: EM | Admit: 2021-10-25 | Discharge: 2021-10-25 | Disposition: A | Discharge status: Home / Self Care | Payer: 59 | Attending: Emergency Medicine | Admitting: Emergency Medicine

## 2021-10-25 DIAGNOSIS — N132 Hydronephrosis with renal and ureteral calculous obstruction: Secondary | ICD-10-CM | POA: Insufficient documentation

## 2021-10-25 DIAGNOSIS — R1032 Left lower quadrant pain: Secondary | ICD-10-CM | POA: Diagnosis present

## 2021-10-25 DIAGNOSIS — N201 Calculus of ureter: Secondary | ICD-10-CM

## 2021-10-25 LAB — URINALYSIS, ROUTINE W REFLEX MICROSCOPIC
Bilirubin Urine: NEGATIVE
Glucose, UA: NEGATIVE mg/dL
Ketones, ur: NEGATIVE mg/dL
Leukocytes,Ua: NEGATIVE
Nitrite: NEGATIVE
Protein, ur: NEGATIVE mg/dL
RBC / HPF: >50 RBC/hpf — ABNORMAL HIGH (ref 0–5)
Specific Gravity, Urine: 1.015 (ref 1.005–1.030)
pH: 5 (ref 5.0–8.0)

## 2021-10-25 LAB — CBC WITH DIFFERENTIAL/PLATELET
Abs Immature Granulocytes: 0.02 10*3/uL (ref 0.00–0.07)
Basophils Absolute: 0 10*3/uL (ref 0.0–0.1)
Basophils Relative: 1 %
Eosinophils Absolute: 0.2 10*3/uL (ref 0.0–0.5)
Eosinophils Relative: 3 %
HCT: 44.1 % (ref 36.0–46.0)
Hemoglobin: 14.4 g/dL (ref 12.0–15.0)
Immature Granulocytes: 0 %
Lymphocytes Relative: 27 %
Lymphs Abs: 1.6 10*3/uL (ref 0.7–4.0)
MCH: 30.1 pg (ref 26.0–34.0)
MCHC: 32.7 g/dL (ref 30.0–36.0)
MCV: 92.1 fL (ref 80.0–100.0)
Monocytes Absolute: 0.6 10*3/uL (ref 0.1–1.0)
Monocytes Relative: 10 %
Neutro Abs: 3.6 10*3/uL (ref 1.7–7.7)
Neutrophils Relative %: 59 %
Platelets: 206 10*3/uL (ref 150–400)
RBC: 4.79 MIL/uL (ref 3.87–5.11)
RDW: 12.6 % (ref 11.5–15.5)
WBC: 6.1 10*3/uL (ref 4.0–10.5)
nRBC: 0 % (ref 0.0–0.2)

## 2021-10-25 LAB — COMPREHENSIVE METABOLIC PANEL
ALT: 20 U/L (ref 0–44)
AST: 24 U/L (ref 15–41)
Albumin: 3.6 g/dL (ref 3.5–5.0)
Alkaline Phosphatase: 78 U/L (ref 38–126)
Anion gap: 7 (ref 5–15)
BUN: 11 mg/dL (ref 8–23)
CO2: 24 mmol/L (ref 22–32)
Calcium: 9.1 mg/dL (ref 8.9–10.3)
Chloride: 109 mmol/L (ref 98–111)
Creatinine, Ser: 0.77 mg/dL (ref 0.44–1.00)
GFR, Estimated: >60 mL/min (ref >60)
Glucose, Bld: 120 mg/dL — ABNORMAL HIGH (ref 70–99)
Potassium: 4.4 mmol/L (ref 3.5–5.1)
Sodium: 140 mmol/L (ref 135–145)
Total Bilirubin: 0.3 mg/dL (ref 0.3–1.2)
Total Protein: 6.8 g/dL (ref 6.5–8.1)

## 2021-10-25 MED ORDER — ONDANSETRON HCL 4 MG/2ML IJ SOLN
4.0000 mg | Freq: Once | INTRAMUSCULAR | Status: AC
  Administered 2021-10-25: 4 mg via INTRAVENOUS
  Filled 2021-10-25: qty 2

## 2021-10-25 MED ORDER — NAPROXEN 375 MG PO TABS
375.0000 mg | ORAL_TABLET | Freq: Two times a day (BID) | ORAL | 0 refills | Status: DC
Start: 2021-10-25 — End: 2023-05-06

## 2021-10-25 MED ORDER — HYDROMORPHONE HCL 1 MG/ML IJ SOLN
1.0000 mg | Freq: Once | INTRAMUSCULAR | Status: AC
  Administered 2021-10-25: 1 mg via INTRAVENOUS
  Filled 2021-10-25: qty 1

## 2021-10-25 MED ORDER — KETOROLAC TROMETHAMINE 30 MG/ML IJ SOLN
30.0000 mg | Freq: Once | INTRAMUSCULAR | Status: AC
  Administered 2021-10-25: 30 mg via INTRAVENOUS
  Filled 2021-10-25: qty 1

## 2021-10-25 MED ORDER — MORPHINE SULFATE (PF) 4 MG/ML IV SOLN
4.0000 mg | Freq: Once | INTRAVENOUS | Status: AC
  Administered 2021-10-25: 4 mg via INTRAVENOUS
  Filled 2021-10-25: qty 1

## 2021-10-25 MED ORDER — SODIUM CHLORIDE 0.9 % IV BOLUS (SEPSIS)
500.0000 mL | Freq: Once | INTRAVENOUS | Status: AC
  Administered 2021-10-25: 500 mL via INTRAVENOUS

## 2021-10-25 MED ORDER — SODIUM CHLORIDE 0.9 % IV SOLN: 1000.0000 mL | INTRAVENOUS | Status: DC

## 2021-10-25 MED ORDER — HYDROCODONE-ACETAMINOPHEN 5-325 MG PO TABS: 1.0000 | ORAL_TABLET | Freq: Four times a day (QID) | ORAL | 0 refills | Status: DC | PRN

## 2021-10-25 MED ORDER — ONDANSETRON 8 MG PO TBDP
8.0000 mg | ORAL_TABLET | Freq: Three times a day (TID) | ORAL | 0 refills | Status: DC | PRN
Start: 2021-10-25 — End: 2023-05-06

## 2021-10-25 NOTE — ED Triage Notes (Signed)
Pt. Stated, Sonia Jackson got back , stomach and in my private area, its like I have a kidney stone. It might have blood in my urine.

## 2021-10-25 NOTE — ED Provider Notes (Signed)
[facility] EMERGENCY DEPARTMENT Provider Note   CSN: [id number] Arrival date & time: 10/25/21  0630     History Chief Complaint  Patient presents with   Back Pain   Vaginal Pain    Sonia Jackson is a 65 y.o. female.   Back Pain Vaginal Pain   Patient presents ED with complaints of lower abdominal pain.  Patient states she started having pain in her lower abdomen and flank this past week.  The pain has been on the lower left side back and also moving towards the midline of her lower abdomen.  Patient also feels a pressure in her bladder pelvic area.  Sometimes is worse when she urinates.  She has not had any burning however with urination.  She thinks she has noticed small amounts of blood.  She has not had any vomiting or diarrhea.  No fevers.  Patient does have history of prior to kidney stones.  She also has had a hysterectomy.  She has never had pain however like this  Past Medical History:  Diagnosis Date   Carpal tunnel syndrome    Hard of hearing    History of kidney stones    PONV (postoperative nausea and vomiting)    nausea only    Patient Active Problem List   Diagnosis Date Noted   Sclerosing adenosis of breast, right 10/01/2016    Past Surgical History:  Procedure Laterality Date   ABDOMINAL HYSTERECTOMY     APPENDECTOMY     arm surgery Left    BREAST EXCISIONAL BIOPSY Right 2017   benign   BREAST LUMPECTOMY WITH RADIOACTIVE SEED LOCALIZATION Right 08/27/2016   Procedure: RIGHT BREAST LUMPECTOMY WITH RADIOACTIVE SEED LOCALIZATION;  Surgeon: Autumn Messing III, MD;  Location: [facility];  Service: General;  Laterality: Right;   CARPAL TUNNEL RELEASE Left    CHOLECYSTECTOMY     COLONOSCOPY     ESOPHAGOGASTRODUODENOSCOPY     FOOT SURGERY Bilateral    KNEE SURGERY Left      OB History   No obstetric history on file.     Family History  Problem Relation Age of Onset   Diabetes Father    Healthy Mother    Breast cancer Neg Hx     Colon cancer Neg Hx    Esophageal cancer Neg Hx    Stomach cancer Neg Hx    Rectal cancer Neg Hx     Social History   Tobacco Use   Smoking status: Never   Smokeless tobacco: Never  Vaping Use   Vaping Use: Never used  Substance Use Topics   Alcohol use: No   Drug use: No    Home Medications Prior to Admission medications   Medication Sig Start Date End Date Taking? Authorizing Provider  HYDROcodone-acetaminophen (NORCO/VICODIN) 5-325 MG tablet Take 1 tablet by mouth every 6 (six) hours as needed. 10/25/21  Yes Dorie Rank, MD  naproxen (NAPROSYN) 375 MG tablet Take 1 tablet (375 mg total) by mouth 2 (two) times daily. 10/25/21  Yes Dorie Rank, MD  ondansetron (ZOFRAN-ODT) 8 MG disintegrating tablet Take 1 tablet (8 mg total) by mouth every 8 (eight) hours as needed for nausea or vomiting. 10/25/21  Yes Dorie Rank, MD  ALPRAZolam Duanne Moron) 0.5 MG tablet Take 0.5 mg by mouth at bedtime as needed for sleep.    [provider]  fluticasone (FLONASE) 50 MCG/ACT nasal spray Place 1 spray into both nostrils daily. 09/22/21   Marney Setting, NP  hydrochlorothiazide 25 MG tablet Take 25 mg by mouth every morning.      [provider]  ibuprofen (ADVIL) 600 MG tablet Take 1 tablet (600 mg total) by mouth every 6 (six) hours as needed. 05/12/20   Domingo Dimes, PA-C  ondansetron (ZOFRAN) 4 MG tablet Take 1 tablet (4 mg total) by mouth every 6 (six) hours. 09/22/21   Marney Setting, NP  predniSONE (STERAPRED UNI-PAK 21 TAB) 10 MG (21) TBPK tablet Take by mouth daily. Take 6 tabs by mouth daily  for 2 days, then 5 tabs for 2 days, then 4 tabs for 2 days, then 3 tabs for 2 days, 2 tabs for 2 days, then 1 tab by mouth daily for 2 days 09/22/21   Marney Setting, NP  traMADol (ULTRAM) 50 MG tablet Take 1 tablet (50 mg total) by mouth every 6 (six) hours as needed. 06/23/20   Raylene Everts, MD    Allergies    Compazine and Compazine  [prochlorperazine]  Review  of Systems   Review of Systems  Genitourinary:  Positive for vaginal pain.  Musculoskeletal:  Positive for back pain.  All other systems reviewed and are negative.  Physical Exam Updated Vital Signs BP (!) 142/71    Pulse 88    Temp 98 F (36.7 C)    Resp 18    Ht 1.499 m (4\' 11" )    Wt 68.5 kg    SpO2 99%    BMI 30.50 kg/m   Physical Exam Vitals and nursing note reviewed.  Constitutional:      General: She is not in acute distress.    Appearance: She is well-developed.  HENT:     Head: Normocephalic and atraumatic.     Right Ear: External ear normal.     Left Ear: External ear normal.  Eyes:     General: No scleral icterus.       Right eye: No discharge.        Left eye: No discharge.     Conjunctiva/sclera: Conjunctivae normal.  Neck:     Trachea: No tracheal deviation.  Cardiovascular:     Rate and Rhythm: Normal rate and regular rhythm.  Pulmonary:     Effort: Pulmonary effort is normal. No respiratory distress.     Breath sounds: Normal breath sounds. No stridor. No wheezing or rales.  Abdominal:     General: Bowel sounds are normal. There is no distension.     Palpations: Abdomen is soft.     Tenderness: There is abdominal tenderness in the suprapubic area and left lower quadrant. There is no guarding or rebound.  Musculoskeletal:        General: No tenderness or deformity.     Cervical back: Neck supple.  Skin:    General: Skin is warm and dry.     Findings: No rash.  Neurological:     General: No focal deficit present.     Mental Status: She is alert.     Cranial Nerves: No cranial nerve deficit (no facial droop, extraocular movements intact, no slurred speech).     Sensory: No sensory deficit.     Motor: No abnormal muscle tone or seizure activity.     Coordination: Coordination normal.  Psychiatric:        Mood and Affect: Mood normal.    ED Results / Procedures / Treatments   Labs (all labs ordered are listed, but only abnormal results are  displayed) Labs Reviewed  COMPREHENSIVE METABOLIC PANEL - Abnormal; Notable for the following components:      Result Value   Glucose, Bld 120 (*)    All other components within normal limits  URINALYSIS, ROUTINE W REFLEX MICROSCOPIC - Abnormal; Notable for the following components:   Hgb urine dipstick LARGE (*)    RBC / HPF >50 (*)    Bacteria, UA RARE (*)    All other components within normal limits  CBC WITH DIFFERENTIAL/PLATELET    EKG None  Radiology CT Renal Stone Study  Result Date: 10/25/2021 CLINICAL DATA:  Flank pain, suspect kidney stones. EXAM: CT ABDOMEN AND PELVIS WITHOUT CONTRAST TECHNIQUE: Multidetector CT imaging of the abdomen and pelvis was performed following the standard protocol without IV contrast. COMPARISON:  January 24, 2021 FINDINGS: Lower chest: Minimal dependent atelectasis of posterior lung bases are noted. The heart size normal. Hepatobiliary: No focal liver abnormality is seen. Status post cholecystectomy. No biliary dilatation. Pancreas: Unremarkable. No pancreatic ductal dilatation or surrounding inflammatory changes. Spleen: Normal in size without focal abnormality. Adrenals/Urinary Tract: Minimal right hydronephrosis is identified. There is a 2 mm stone in the mid right ureter image 66 series 3. There is a 3-4 mm stone in the distal right ureter image 76 series 3. Nonobstructing stones are identified in both kidneys. There is no left hydronephrosis. The bladder is normal. Stomach/Bowel: Stomach is within normal limits. Status post prior appendectomy. No evidence of bowel wall thickening, distention, or inflammatory changes. Vascular/Lymphatic: Aortic atherosclerosis. No enlarged abdominal or pelvic lymph nodes. Reproductive: Status post hysterectomy. No adnexal masses. Other: None. Musculoskeletal: Degenerative joint changes of the spine are noted. IMPRESSION: 1. Minimal right hydronephrosis with a 2 mm stone in the mid right ureter. There is a 3-4 mm stone in  the distal right ureter. 2. Nonobstructing stones are identified in both kidneys. 3. Aortic atherosclerosis. Aortic Atherosclerosis (ICD10-I70.0). Electronically Signed   By: Abelardo Diesel M.D.   On: 10/25/2021 14:13    Procedures Procedures   Medications Ordered in ED Medications  sodium chloride 0.9 % bolus 500 mL (0 mLs Intravenous Stopped 10/25/21 1515)    Followed by  0.9 %  sodium chloride infusion (has no administration in time range)  morphine 4 MG/ML injection 4 mg (4 mg Intravenous Given 10/25/21 1204)  ondansetron (ZOFRAN) injection 4 mg (4 mg Intravenous Given 10/25/21 1203)  HYDROmorphone (DILAUDID) injection 1 mg (1 mg Intravenous Given 10/25/21 1242)  ketorolac (TORADOL) 30 MG/ML injection 30 mg (30 mg Intravenous Given 10/25/21 1523)    ED Course  I have reviewed the triage vital signs and the nursing notes.  Pertinent labs & imaging results that were available during my care of the patient were reviewed by me and considered in my medical decision making (see chart for details).  Clinical Course as of 10/25/21 1548  Sat Oct 25, 2021  1417 CT scan demonstrates 2 stones in the right ureter.  Patient also has bilateral renal stones. [JK]  2671 CBC and metabolic panel unremarkable.  Urinalysis does show hematuria [JK]    Clinical Course User Index [JK] Dorie Rank, MD   MDM Rules/Calculators/A&P                         Patient presented with complaints of flank pain presentation concerning for the possibility of diverticulitis colitis pyelonephritis kidney stone.  Laboratory tests are otherwise unremarkable other than hematuria.  CT scan confirmed a right-sided ureteral stone.  Patient actually had 2  stones.  Patient was treated with IV pain medications and antiemetics.  Her symptoms are improved and she is feeling better.  Will discharge home with medications for pain outpatient follow-up with urology.    Final Clinical Impression(s) / ED Diagnoses Final diagnoses:   Ureteral stone    Rx / DC Orders ED Discharge Orders          Ordered    naproxen (NAPROSYN) 375 MG tablet  2 times daily        10/25/21 1547    ondansetron (ZOFRAN-ODT) 8 MG disintegrating tablet  Every 8 hours PRN        10/25/21 1547    HYDROcodone-acetaminophen (NORCO/VICODIN) 5-325 MG tablet  Every 6 hours PRN        10/25/21 1547             Dorie Rank, MD 10/25/21 1548

## 2021-10-25 NOTE — ED Notes (Signed)
The pt has been told that she is being discharged

## 2021-10-25 NOTE — Discharge Instructions (Signed)
Use the urine strainer to help determine if you passed your stone.  Take the medications as needed for pain.  Follow-up with the urologist for further evaluation if the symptoms persist or if you do not pass the stone

## 2021-10-25 NOTE — ED Provider Notes (Signed)
Emergency Medicine Provider Triage Evaluation Note  Sonia Jackson , a 65 y.o. female  was evaluated in triage.  Pt complains of flank pain and lower abdominal pain.  Patient reports that for the past week she has been having pain primarily in her left low back.  She reports pain in her lower abdomen as well.  She reports it feels like pressure and pain is worse when she tries to urinate.  She has noticed a small amount of blood in her urine.  No vomiting, no fevers.  Does have prior history of kidney stones and is concerned this may be the same  Review of Systems  Positive: Flank pain, back pain, lower abdominal pain, hematuria Negative: Fevers, vomiting  Physical Exam  BP (!) 168/77 (BP Location: Right Arm)    Pulse 71    Temp 98 F (36.7 C) (Oral)    Resp 19    SpO2 100%  Gen:   Awake, no distress   Resp:  Normal effort  MSK:   Moves extremities without difficulty  Other:  Tenderness over the left flank and suprapubic region  Medical Decision Making  Medically screening exam initiated at 8:54 AM.  Appropriate orders placed.  Sonia Jackson was informed that the remainder of the evaluation will be completed by another provider, this initial triage assessment does not replace that evaluation, and the importance of remaining in the ED until their evaluation is complete.  Work-up for potential kidney stone versus urinary tract infection, could also be musculoskeletal pain.   Jacqlyn Larsen, PA-C 10/25/21 8102    Lajean Saver, MD 10/25/21 1318

## 2021-11-17 ENCOUNTER — Other Ambulatory Visit: Payer: Self-pay

## 2021-11-17 ENCOUNTER — Encounter (HOSPITAL_COMMUNITY): Payer: Self-pay | Admitting: Urology

## 2021-11-17 ENCOUNTER — Other Ambulatory Visit: Payer: Self-pay | Admitting: Urology

## 2021-11-17 NOTE — Progress Notes (Addendum)
COVID swab appointment: N/A  COVID Vaccine Completed: Date COVID Vaccine completed: Has received booster: COVID vaccine manufacturer: [employer]   Date of COVID positive in last 90 days:  No  PCP - Sueanne Margarita, DO Cardiologist -   Chest x-ray -  EKG -  Stress Test - greater than 10 years Epic ECHO -  Cardiac Cath -  Pacemaker/ICD device last checked: Spinal Cord Stimulator:  Sleep Study - N/A CPAP -   Fasting Blood Sugar - N/A Checks Blood Sugar _____ times a day  Blood Thinner Instructions:  N/A Aspirin Instructions: Last Dose:  Activity level:  Can go up a flight of stairs and perform activities of daily living without stopping and without symptoms of chest pain or shortness of breath.    Anesthesia review:   Patient denies shortness of breath, fever, cough and chest pain at PAT appointment   Patient verbalized understanding of instructions that were given to them at the PAT appointment. Patient was also instructed that they will need to review over the PAT instructions again at home before surgery.

## 2021-11-19 ENCOUNTER — Ambulatory Visit (HOSPITAL_COMMUNITY): Payer: Commercial Managed Care - HMO

## 2021-11-19 ENCOUNTER — Encounter (HOSPITAL_COMMUNITY)
Admission: RE | Disposition: A | Discharge status: Home / Self Care | Payer: Self-pay | Source: Home / Self Care | Attending: Urology

## 2021-11-19 ENCOUNTER — Ambulatory Visit (HOSPITAL_COMMUNITY)
Admission: RE | Admit: 2021-11-19 | Discharge: 2021-11-19 | Disposition: A | Discharge status: Home / Self Care | Payer: Commercial Managed Care - HMO | Attending: Urology | Admitting: Urology

## 2021-11-19 ENCOUNTER — Ambulatory Visit (HOSPITAL_COMMUNITY): Payer: Commercial Managed Care - HMO | Admitting: Certified Registered"

## 2021-11-19 ENCOUNTER — Encounter (HOSPITAL_COMMUNITY): Payer: Self-pay | Admitting: Urology

## 2021-11-19 DIAGNOSIS — N202 Calculus of kidney with calculus of ureter: Secondary | ICD-10-CM | POA: Insufficient documentation

## 2021-11-19 DIAGNOSIS — Z79899 Other long term (current) drug therapy: Secondary | ICD-10-CM | POA: Insufficient documentation

## 2021-11-19 DIAGNOSIS — F419 Anxiety disorder, unspecified: Secondary | ICD-10-CM | POA: Diagnosis not present

## 2021-11-19 DIAGNOSIS — Z791 Long term (current) use of non-steroidal anti-inflammatories (NSAID): Secondary | ICD-10-CM | POA: Insufficient documentation

## 2021-11-19 HISTORY — PX: CYSTOSCOPY WITH RETROGRADE PYELOGRAM, URETEROSCOPY AND STENT PLACEMENT: SHX5789

## 2021-11-19 HISTORY — DX: Anxiety disorder, unspecified: F41.9

## 2021-11-19 SURGERY — CYSTOURETEROSCOPY, WITH RETROGRADE PYELOGRAM AND STENT INSERTION
Anesthesia: General | Laterality: Right

## 2021-11-19 MED ORDER — PHENAZOPYRIDINE HCL 200 MG PO TABS: 200.0000 mg | ORAL_TABLET | Freq: Three times a day (TID) | ORAL | 1 refills | Status: AC | PRN

## 2021-11-19 MED ORDER — FENTANYL CITRATE (PF) 100 MCG/2ML IJ SOLN
INTRAMUSCULAR | Status: AC
  Filled 2021-11-19: qty 2

## 2021-11-19 MED ORDER — PROPOFOL 10 MG/ML IV BOLUS
INTRAVENOUS | Status: DC | PRN
  Administered 2021-11-19: 180 mg via INTRAVENOUS

## 2021-11-19 MED ORDER — MIDAZOLAM HCL 5 MG/5ML IJ SOLN
INTRAMUSCULAR | Status: DC | PRN
  Administered 2021-11-19: 2 mg via INTRAVENOUS

## 2021-11-19 MED ORDER — ONDANSETRON HCL 4 MG/2ML IJ SOLN
INTRAMUSCULAR | Status: AC
  Filled 2021-11-19: qty 2

## 2021-11-19 MED ORDER — CEFAZOLIN SODIUM-DEXTROSE 2-4 GM/100ML-% IV SOLN
2.0000 g | INTRAVENOUS | Status: AC
  Administered 2021-11-19: 10:00:00 2 g via INTRAVENOUS
  Filled 2021-11-19: qty 100

## 2021-11-19 MED ORDER — ONDANSETRON HCL 4 MG/2ML IJ SOLN
INTRAMUSCULAR | Status: DC | PRN
  Administered 2021-11-19: 4 mg via INTRAVENOUS

## 2021-11-19 MED ORDER — DEXAMETHASONE SODIUM PHOSPHATE 10 MG/ML IJ SOLN
INTRAMUSCULAR | Status: AC
  Filled 2021-11-19: qty 1

## 2021-11-19 MED ORDER — HYDROCODONE-ACETAMINOPHEN 5-325 MG PO TABS: 1.0000 | ORAL_TABLET | Freq: Four times a day (QID) | ORAL | 0 refills | Status: DC | PRN

## 2021-11-19 MED ORDER — ONDANSETRON HCL 4 MG/2ML IJ SOLN: 4.0000 mg | Freq: Once | INTRAMUSCULAR | Status: DC | PRN

## 2021-11-19 MED ORDER — FENTANYL CITRATE PF 50 MCG/ML IJ SOSY: 25.0000 ug | PREFILLED_SYRINGE | INTRAMUSCULAR | Status: DC | PRN

## 2021-11-19 MED ORDER — ORAL CARE MOUTH RINSE
15.0000 mL | Freq: Once | OROMUCOSAL | Status: AC
  Administered 2021-11-19: 09:00:00 15 mL via OROMUCOSAL

## 2021-11-19 MED ORDER — DEXAMETHASONE SODIUM PHOSPHATE 10 MG/ML IJ SOLN
INTRAMUSCULAR | Status: DC | PRN
Start: 2021-11-19 — End: 2021-11-19
  Administered 2021-11-19: 10 mg via INTRAVENOUS

## 2021-11-19 MED ORDER — OXYBUTYNIN CHLORIDE 5 MG PO TABS: 5.0000 mg | ORAL_TABLET | Freq: Three times a day (TID) | ORAL | 1 refills | Status: DC | PRN

## 2021-11-19 MED ORDER — FENTANYL CITRATE (PF) 100 MCG/2ML IJ SOLN
INTRAMUSCULAR | Status: DC | PRN
  Administered 2021-11-19: 25 ug via INTRAVENOUS
  Administered 2021-11-19: 50 ug via INTRAVENOUS
  Administered 2021-11-19: 25 ug via INTRAVENOUS

## 2021-11-19 MED ORDER — MIDAZOLAM HCL 2 MG/2ML IJ SOLN
INTRAMUSCULAR | Status: AC
  Filled 2021-11-19: qty 2

## 2021-11-19 MED ORDER — LACTATED RINGERS IV SOLN: INTRAVENOUS | Status: DC

## 2021-11-19 MED ORDER — LIDOCAINE HCL (PF) 2 % IJ SOLN
INTRAMUSCULAR | Status: AC
  Filled 2021-11-19: qty 5

## 2021-11-19 MED ORDER — KETOROLAC TROMETHAMINE 30 MG/ML IJ SOLN: 30.0000 mg | Freq: Once | INTRAMUSCULAR | Status: DC | PRN

## 2021-11-19 MED ORDER — CHLORHEXIDINE GLUCONATE 0.12 % MT SOLN: 15.0000 mL | Freq: Once | OROMUCOSAL | Status: AC

## 2021-11-19 MED ORDER — SODIUM CHLORIDE 0.9 % IR SOLN
Status: DC | PRN
  Administered 2021-11-19: 3000 mL via INTRAVESICAL

## 2021-11-19 MED ORDER — LIDOCAINE 2% (20 MG/ML) 5 ML SYRINGE
INTRAMUSCULAR | Status: DC | PRN
  Administered 2021-11-19: 100 mg via INTRAVENOUS

## 2021-11-19 MED ORDER — CEPHALEXIN 500 MG PO CAPS: 500.0000 mg | ORAL_CAPSULE | Freq: Two times a day (BID) | ORAL | 0 refills | Status: AC

## 2021-11-19 MED ORDER — PROPOFOL 10 MG/ML IV BOLUS
INTRAVENOUS | Status: AC
  Filled 2021-11-19: qty 20

## 2021-11-19 MED ORDER — IOHEXOL 300 MG/ML  SOLN
INTRAMUSCULAR | Status: DC | PRN
  Administered 2021-11-19: 10:00:00 5 mL

## 2021-11-19 SURGICAL SUPPLY — 22 items
BAG COUNTER SPONGE SURGICOUNT (BAG) IMPLANT
BAG URO CATCHER STRL LF (MISCELLANEOUS) ×2 IMPLANT
BASKET ZERO TIP NITINOL 2.4FR (BASKET) ×1 IMPLANT
CATH URETERAL DUAL LUMEN 10F (MISCELLANEOUS) ×1 IMPLANT
CATH URETL OPEN END 6FR 70 (CATHETERS) IMPLANT
CLOTH BEACON ORANGE TIMEOUT ST (SAFETY) ×2 IMPLANT
GLOVE SURG MICRO LTX SZ7 (GLOVE) ×2 IMPLANT
GOWN STRL REUS W/TWL LRG LVL3 (GOWN DISPOSABLE) ×3 IMPLANT
GUIDEWIRE STR DUAL SENSOR (WIRE) ×1 IMPLANT
GUIDEWIRE ZIPWRE .038 STRAIGHT (WIRE) ×2 IMPLANT
IV NS 1000ML (IV SOLUTION) ×2
IV NS 1000ML BAXH (IV SOLUTION) ×1 IMPLANT
KIT TURNOVER KIT A (KITS) ×1 IMPLANT
LASER FIB FLEXIVA PULSE ID 365 (Laser) IMPLANT
MANIFOLD NEPTUNE II (INSTRUMENTS) ×2 IMPLANT
PACK CYSTO (CUSTOM PROCEDURE TRAY) ×2 IMPLANT
SHEATH NAVIGATOR HD 11/13X36 (SHEATH) IMPLANT
STENT URET 6FRX22 CONTOUR (STENTS) ×1 IMPLANT
TRACTIP FLEXIVA PULS ID 200XHI (Laser) IMPLANT
TRACTIP FLEXIVA PULSE ID 200 (Laser)
TUBING CONNECTING 10 (TUBING) ×2 IMPLANT
TUBING UROLOGY SET (TUBING) ×2 IMPLANT

## 2021-11-19 NOTE — Anesthesia Postprocedure Evaluation (Signed)
Anesthesia Post Note  Patient: Ronnette Hila  Procedure(s) Performed: CYSTOSCOPY WITH RIGHT RETROGRADE PYELOGRAM, URETEROSCOPY WITH  HOLMIUM LASER AND STENT PLACEMENT (Right)     Patient location during evaluation: PACU Anesthesia Type: General Level of consciousness: awake and alert Pain management: pain level controlled Vital Signs Assessment: post-procedure vital signs reviewed and stable Respiratory status: spontaneous breathing, nonlabored ventilation, respiratory function stable and patient connected to nasal cannula oxygen Cardiovascular status: blood pressure returned to baseline and stable Postop Assessment: no apparent nausea or vomiting Anesthetic complications: no   No notable events documented.  Last Vitals:  Vitals:   11/19/21 1045 11/19/21 1100  BP: (!) 159/75 (!) 158/72  Pulse: 84 85  Resp: 12 11  Temp:    SpO2: 100% 100%    Last Pain:  Vitals:   11/19/21 1100  TempSrc:   PainSc: 0-No pain                 Zenas Santa S

## 2021-11-19 NOTE — Anesthesia Procedure Notes (Signed)
Procedure Name: LMA Insertion Date/Time: 11/19/2021 9:40 AM Performed by: Shana Younge D, CRNA Pre-anesthesia Checklist: Patient identified, Emergency Drugs available, Suction available and Patient being monitored Patient Re-evaluated:Patient Re-evaluated prior to induction Oxygen Delivery Method: Circle system utilized Preoxygenation: Pre-oxygenation with 100% oxygen Induction Type: IV induction Ventilation: Mask ventilation without difficulty LMA: LMA inserted LMA Size: 4.0 Tube type: Oral Number of attempts: 1 Placement Confirmation: positive ETCO2 and breath sounds checked- equal and bilateral Tube secured with: Tape Dental Injury: Teeth and Oropharynx as per pre-operative assessment

## 2021-11-19 NOTE — Anesthesia Preprocedure Evaluation (Signed)
Anesthesia Evaluation  Patient identified by MRN, date of birth, ID band Patient awake    Reviewed: Allergy & Precautions, NPO status , Patient's Chart, lab work & pertinent test results  History of Anesthesia Complications (+) PONV  Airway Mallampati: III  TM Distance: <3 FB Neck ROM: Full    Dental no notable dental hx.    Pulmonary neg pulmonary ROS,    Pulmonary exam normal breath sounds clear to auscultation       Cardiovascular negative cardio ROS Normal cardiovascular exam Rhythm:Regular Rate:Normal     Neuro/Psych Anxiety negative neurological ROS     GI/Hepatic negative GI ROS, Neg liver ROS,   Endo/Other  negative endocrine ROS  Renal/GU negative Renal ROS  negative genitourinary   Musculoskeletal negative musculoskeletal ROS (+)   Abdominal   Peds negative pediatric ROS (+)  Hematology negative hematology ROS (+)   Anesthesia Other Findings   Reproductive/Obstetrics negative OB ROS                             Anesthesia Physical Anesthesia Plan  ASA: 2  Anesthesia Plan: General   Post-op Pain Management:    Induction: Intravenous  PONV Risk Score and Plan: 4 or greater and Ondansetron, Dexamethasone and Treatment may vary due to age or medical condition  Airway Management Planned: LMA  Additional Equipment:   Intra-op Plan:   Post-operative Plan: Extubation in OR  Informed Consent: I have reviewed the patients History and Physical, chart, labs and discussed the procedure including the risks, benefits and alternatives for the proposed anesthesia with the patient or authorized representative who has indicated his/her understanding and acceptance.     Dental advisory given  Plan Discussed with: CRNA and Surgeon  Anesthesia Plan Comments:         Anesthesia Quick Evaluation

## 2021-11-19 NOTE — Discharge Instructions (Signed)

## 2021-11-19 NOTE — Op Note (Signed)
Operative Note  Preoperative diagnosis:  1.  Right ureteral and renal stone  Postoperative diagnosis: same  Procedure(s): 1.  Right ureteroscopy with laser lithotripsy and basket extraction of stones 2. Cystoscopy  3. R retrograde pyelogram 4. R ureteral stent placement (6x22 cm) 5. Fluoroscopy with intraoperative interpretation  Surgeon: Donald Pore, MD  Assistants:  None  Anesthesia:  General  EBL:  Minimal  Specimens: 1. stones for stone analysis (to be done at [facility])  Drains/Catheters: 1.  6Fr x 22cm ureteral stent without tether string  Intraoperative findings:   Cystoscopy demonstrated unremarkable bladder Ureteroscopy demonstrated one ureteral stone and two small stones in the kidney Successful stent placement. Small amount of contrast extravasation on final retrograde, no obvious collecting system or proximal ureter defects  Indication:  Sonia Jackson is a 66 y.o. female with symptomatic right ureteral stones.  Description of procedure: After informed consent was obtained from the patient, the patient was identified and taken to the operating room and placed in the supine position.  General anesthesia was administered as well as perioperative IV antibiotics.  At the beginning of the case, a time-out was performed to properly identify the patient, the surgery to be performed, and the surgical site.  Sequential compression devices were applied to the lower extremities at the beginning of the case for DVT prophylaxis.  The patient was then placed in the dorsal lithotomy supine position, prepped and draped in sterile fashion.  Preliminary scout fluoroscopy revealed no obvious calcifications. We then passed the 21-French rigid cystoscope through the urethra and into the bladder under vision without any difficulty, noting a normal urethra..  A systematic evaluation of the bladder revealed no evidence of any suspicious bladder lesions.  Ureteral orifices were  in normal position.     Under cystoscopic and flouroscopic guidance, we cannulated the ureteral orifice with a 5-French open-ended ureteral catheter and a gentle retrograde pyelogram was performed, revealing a normal caliber ureter without any filling defects. There was mild hydronephrosis of the collecting system.  A 0.038 glide wire was then passed up to the level of the renal pelvis and secured to the drape as a safety wire. The ureteral catheter and cystoscope were removed, leaving the safety wire in place.   A semi-rigid ureteroscope was passed alongside the wire up the distal ureter which appeared normal. The stone was identified, and removed with the zero tip basket under direct visualization.  The semi-rigid ureteroscope was removed. A flexible ureteroscope was then inserted over the glide wire into the collecting system, which was examined. Two stones were identified. Each was dusted with the laser. A retrograde pyelogram was performed, and there was a small amount of contrast extravasation. There were no obvious holes or defects in the collecting system or proximal ureter.   The glide was was then reinserted through the flexible ureteroscope as it was being withdrawn. The ureter was carefully inspected and there were no residual stone fragments or defects.   Under fluoroscopic guidance a 6-French x 22 cm cm double-pigtail ureteral stent was placed up the right ureter, making sure that the proximal and distal ends coiled within the kidney and bladder respectively. The cystoscope was then advanced back into the bladder under vision.  We were able to see the distal stent coiling nicely within the bladder.  The bladder was then emptied with irrigation solution.  The cystoscope was then removed.    The patient tolerated the procedure well and there was no complication. Patient was awoken  from anesthesia and taken to the recovery room in stable condition. I was present and scrubbed for the entirety  of the case.  Plan:  Patient will be discharged home and will f/u in 2 weeks for stent removal.    Daryll Brod MD [facility]  Pager: [phone]

## 2021-11-19 NOTE — Transfer of Care (Signed)
Immediate Anesthesia Transfer of Care Note  Patient: Sonia Jackson  Procedure(s) Performed: CYSTOSCOPY WITH RIGHT RETROGRADE PYELOGRAM, URETEROSCOPY WITH  HOLMIUM LASER AND STENT PLACEMENT (Right)  Patient Location: PACU  Anesthesia Type:General  Level of Consciousness: drowsy and patient cooperative  Airway & Oxygen Therapy: Patient Spontanous Breathing and Patient connected to face mask oxygen  Post-op Assessment: Report given to RN and Post -op Vital signs reviewed and stable  Post vital signs: Reviewed and stable  Last Vitals:  Vitals Value Taken Time  BP    Temp 36.7 C 11/19/21 1038  Pulse 84 11/19/21 1038  Resp 13 11/19/21 1038  SpO2 100 % 11/19/21 1038  Vitals shown include unvalidated device data.  Last Pain:  Vitals:   11/19/21 0818  TempSrc: Oral         Complications: No notable events documented.

## 2021-11-19 NOTE — H&P (Signed)
H&P  History of Present Illness: Sonia Jackson is a 66 y.o. year old with R ureteral stones who presents today for R ureteroscopy with possible laser litho + stone extraction  Past Medical History:  Diagnosis Date   Anxiety    Hard of hearing    History of kidney stones    PONV (postoperative nausea and vomiting)    nausea only    Past Surgical History:  Procedure Laterality Date   ABDOMINAL HYSTERECTOMY     APPENDECTOMY     arm surgery Left    BREAST EXCISIONAL BIOPSY Right 2017   benign   BREAST LUMPECTOMY WITH RADIOACTIVE SEED LOCALIZATION Right 08/27/2016   Procedure: RIGHT BREAST LUMPECTOMY WITH RADIOACTIVE SEED LOCALIZATION;  Surgeon: Autumn Messing III, MD;  Location: [facility];  Service: General;  Laterality: Right;   CARPAL TUNNEL RELEASE Left    CHOLECYSTECTOMY     COLONOSCOPY     ESOPHAGOGASTRODUODENOSCOPY     FOOT SURGERY Bilateral    KNEE SURGERY Left     Home Medications:  No current facility-administered medications on file prior to encounter.   Current Outpatient Medications on File Prior to Encounter  Medication Sig Dispense Refill   alendronate (FOSAMAX) 70 MG tablet Take 70 mg by mouth once a week.     ALPRAZolam (XANAX) 0.5 MG tablet Take 0.5 mg by mouth at bedtime as needed for sleep.     Cholecalciferol (VITAMIN D3 PO) Take 1 tablet by mouth daily.     HYDROcodone-acetaminophen (NORCO/VICODIN) 5-325 MG tablet Take 1 tablet by mouth every 6 (six) hours as needed. 12 tablet 0   ondansetron (ZOFRAN) 4 MG tablet Take 1 tablet (4 mg total) by mouth every 6 (six) hours. 12 tablet 0   tamsulosin (FLOMAX) 0.4 MG CAPS capsule Take 0.4 mg by mouth daily.     fluticasone (FLONASE) 50 MCG/ACT nasal spray Place 1 spray into both nostrils daily. (Patient not taking: Reported on 11/17/2021) 16 g 2   naproxen (NAPROSYN) 375 MG tablet Take 1 tablet (375 mg total) by mouth 2 (two) times daily. (Patient not taking: Reported on 11/17/2021) 20 tablet 0   ondansetron (ZOFRAN-ODT)  8 MG disintegrating tablet Take 1 tablet (8 mg total) by mouth every 8 (eight) hours as needed for nausea or vomiting. (Patient not taking: Reported on 11/17/2021) 12 tablet 0     Allergies:  Allergies  Allergen Reactions   Compazine Other (See Comments)    Severe numbness to face and legs   Compazine  [Prochlorperazine]     Family History  Problem Relation Age of Onset   Diabetes Father    Healthy Mother    Breast cancer Neg Hx    Colon cancer Neg Hx    Esophageal cancer Neg Hx    Stomach cancer Neg Hx    Rectal cancer Neg Hx     Social History:  reports that she has never smoked. She has never used smokeless tobacco. She reports that she does not drink alcohol and does not use drugs.  ROS: A complete review of systems was performed.  All systems are negative except for pertinent findings as noted.  Physical Exam:  Vital signs in last 24 hours: Temp:  [98.4 F (36.9 C)] 98.4 F (36.9 C) (01/25 0818) Pulse Rate:  [90] 90 (01/25 0818) Resp:  [16] 16 (01/25 0818) BP: (136)/(83) 136/83 (01/25 0818) SpO2:  [96 %] 96 % (01/25 0818) Constitutional:  Alert and oriented, No acute distress Cardiovascular: Regular rate  and rhythm, No JVD Respiratory: Normal respiratory effort, Lungs clear bilaterally GI: Abdomen is soft, nontender, nondistended, no abdominal masses Neurologic: Grossly intact, no focal deficits Psychiatric: Normal mood and affect  Laboratory Data:  No results for input(s): WBC, HGB, HCT, PLT in the last 72 hours.  No results for input(s): NA, K, CL, GLUCOSE, BUN, CALCIUM, CREATININE in the last 72 hours.  Invalid input(s): CO3   No results found for this or any previous visit (from the past 24 hour(s)). No results found for this or any previous visit (from the past 240 hour(s)).  Renal Function: No results for input(s): CREATININE in the last 168 hours. CrCl cannot be calculated (Patient's most recent lab result is older than the maximum 21 days  allowed.).  Radiologic Imaging: No results found.  Impression/Assessment:  66 yo F with R ureteral stones x 2  Plan:  To OR for ureteroscopy with possible laser litho, stent placement. WE discussed risks of the operation, including but not limited to bleeding, infection, sepsis, need for future procedures, inability to address the stones at this time, ureteral injury, prolonged stent placement.  Donald Pore MD 11/19/2021, 8:43 AM  [facility]  Pager: [phone]

## 2021-11-20 ENCOUNTER — Encounter (HOSPITAL_COMMUNITY): Payer: Self-pay | Admitting: Urology

## 2022-03-20 ENCOUNTER — Other Ambulatory Visit: Payer: Self-pay | Admitting: Internal Medicine

## 2022-03-20 DIAGNOSIS — Z1231 Encounter for screening mammogram for malignant neoplasm of breast: Secondary | ICD-10-CM

## 2022-07-30 ENCOUNTER — Ambulatory Visit
Admission: RE | Admit: 2022-07-30 | Discharge: 2022-07-30 | Disposition: A | Discharge status: Home / Self Care | Payer: Medicare Other | Source: Ambulatory Visit | Attending: Internal Medicine | Admitting: Internal Medicine

## 2022-07-30 DIAGNOSIS — Z1231 Encounter for screening mammogram for malignant neoplasm of breast: Secondary | ICD-10-CM

## 2022-07-31 ENCOUNTER — Other Ambulatory Visit: Payer: Self-pay | Admitting: Internal Medicine

## 2022-07-31 DIAGNOSIS — R928 Other abnormal and inconclusive findings on diagnostic imaging of breast: Secondary | ICD-10-CM

## 2022-08-12 ENCOUNTER — Ambulatory Visit
Admission: RE | Admit: 2022-08-12 | Discharge: 2022-08-12 | Disposition: A | Discharge status: Home / Self Care | Payer: Medicare Other | Source: Ambulatory Visit | Attending: Internal Medicine | Admitting: Internal Medicine

## 2022-08-12 DIAGNOSIS — R928 Other abnormal and inconclusive findings on diagnostic imaging of breast: Secondary | ICD-10-CM

## 2022-10-14 IMAGING — CT CT RENAL STONE PROTOCOL
2 of 4 series · 16 of 46 positions shown, 18 images · non-contrast
Comparison: January 24, 2021

CLINICAL DATA: Flank pain, suspect kidney stones.

EXAM:
CT ABDOMEN AND PELVIS WITHOUT CONTRAST
TECHNIQUE: Multidetector CT imaging of the abdomen and pelvis was performed
following the standard protocol without IV contrast.

[Series 3: renal stone 5.0 · axial · 0.73mm/px · z∈[-1188,-748]mm · 13 of 97 slices shown, 15 images]
[im 5/97  soft-tissue]
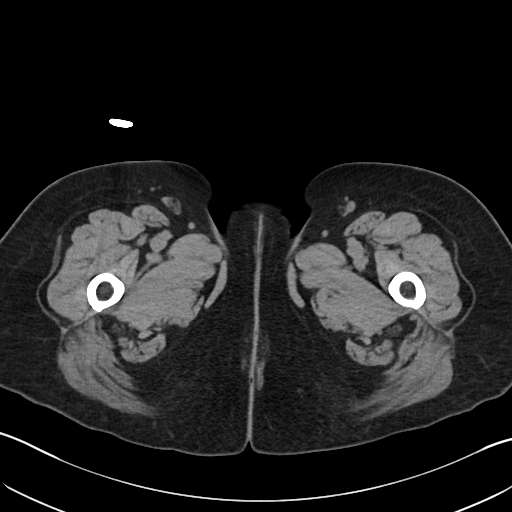
[im 5/97  bone]
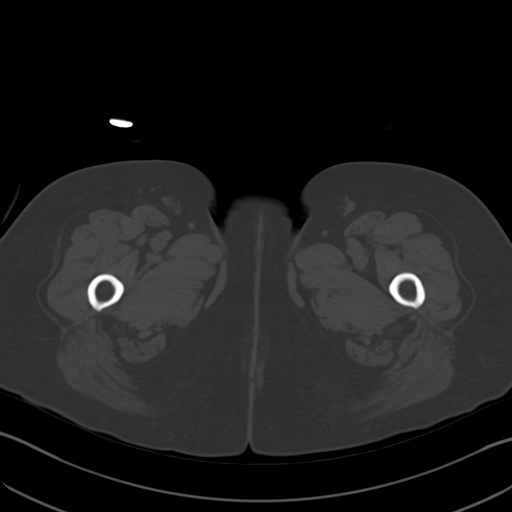
[im 13/97  soft-tissue]
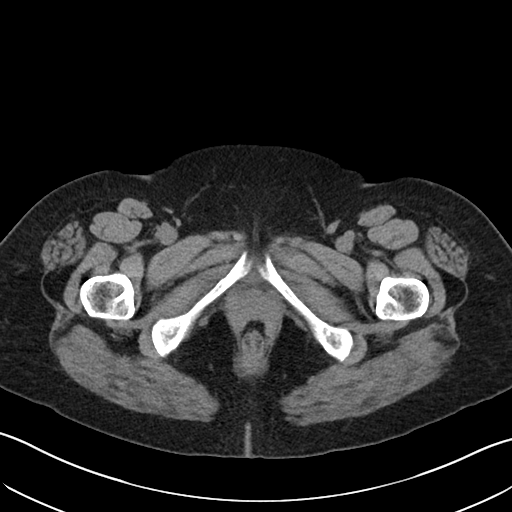
[im 21/97  soft-tissue]
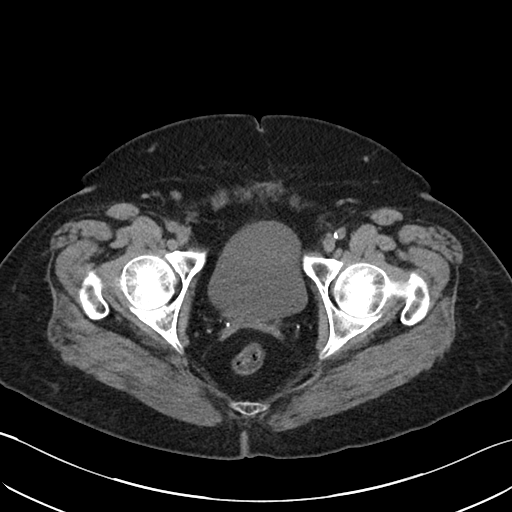
[im 29/97  soft-tissue]
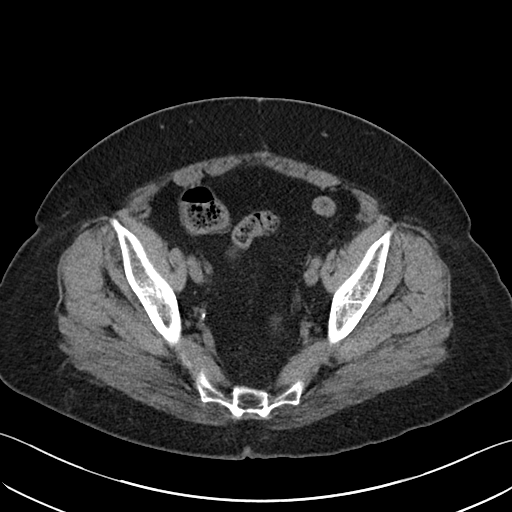
[im 33/97  soft-tissue]
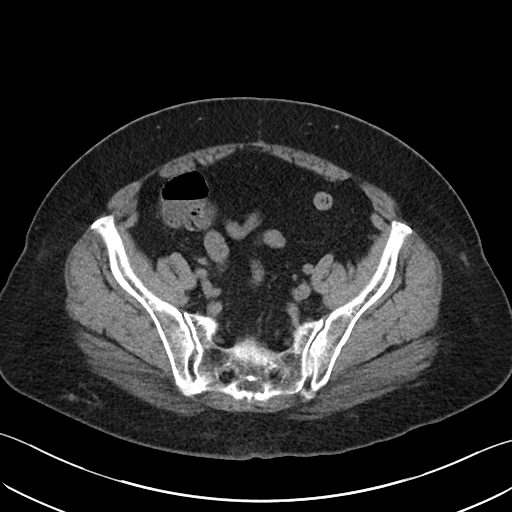
[im 41/97  soft-tissue]
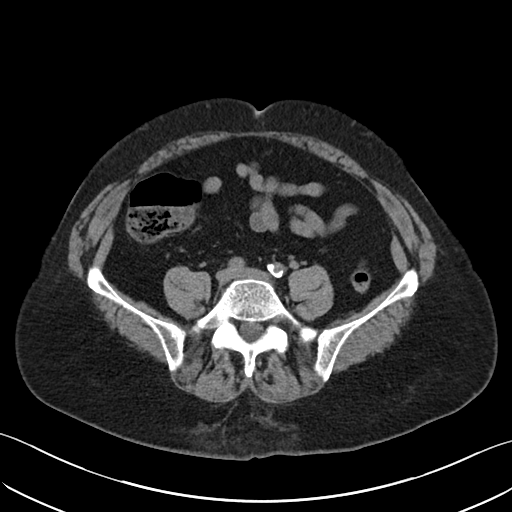
[im 49/97  soft-tissue]
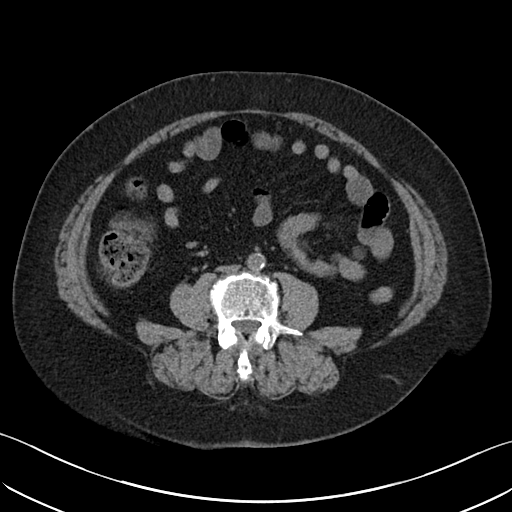
[im 57/97  soft-tissue]
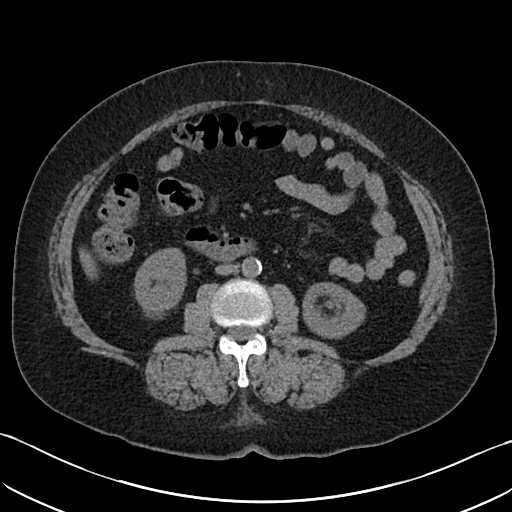
[im 65/97  soft-tissue]
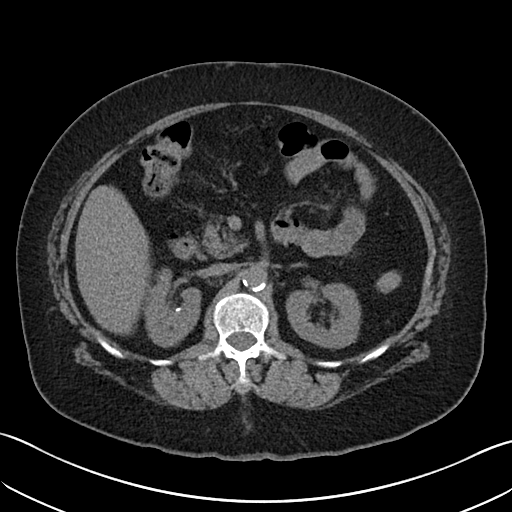
[im 65/97  bone]
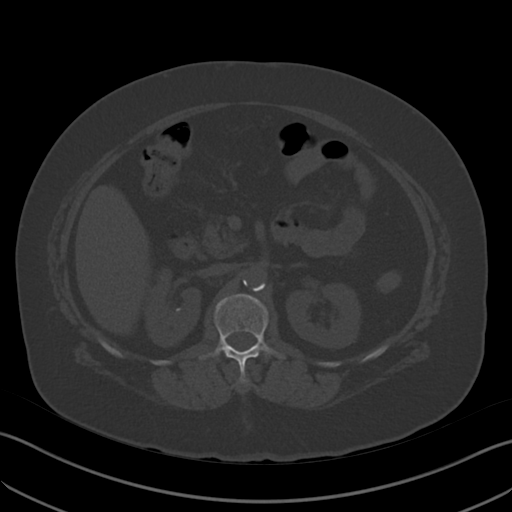
[im 69/97  soft-tissue]
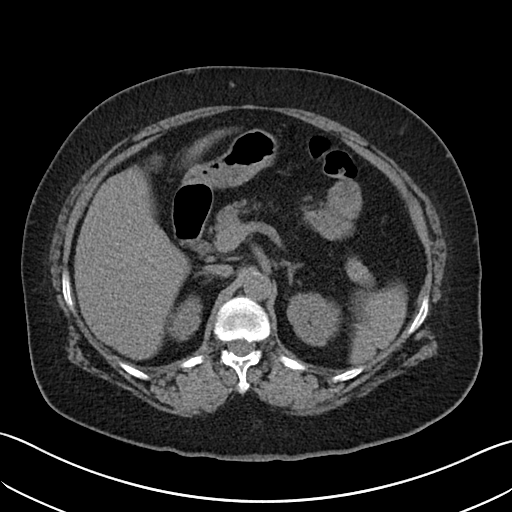
[im 77/97  soft-tissue]
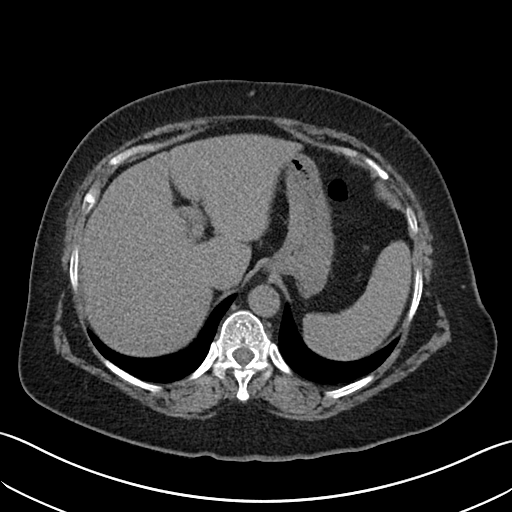
[im 85/97  soft-tissue]
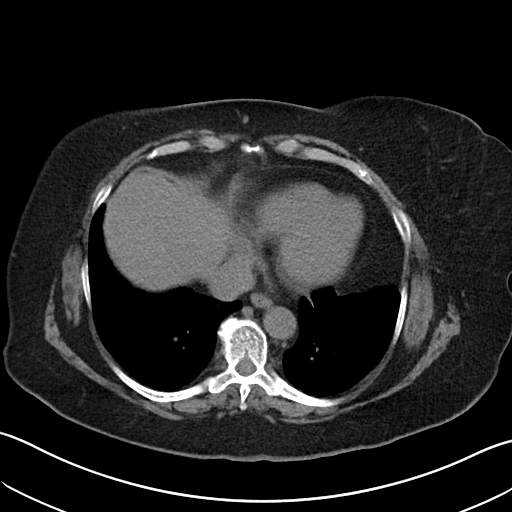
[im 93/97  soft-tissue]
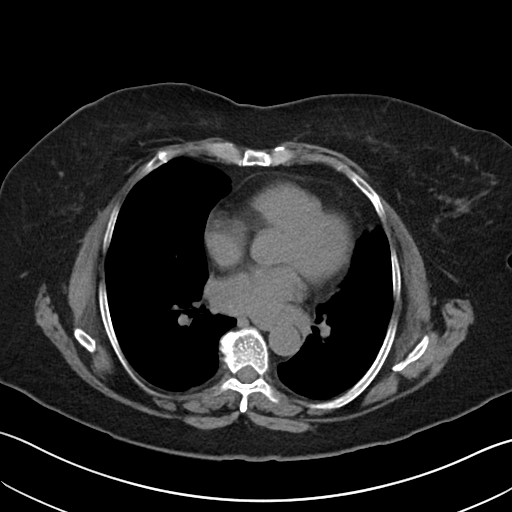

[Series 6: cor · coronal · 0.76mm/px · 3 of 136 slices shown]
[im 46/136  soft-tissue]
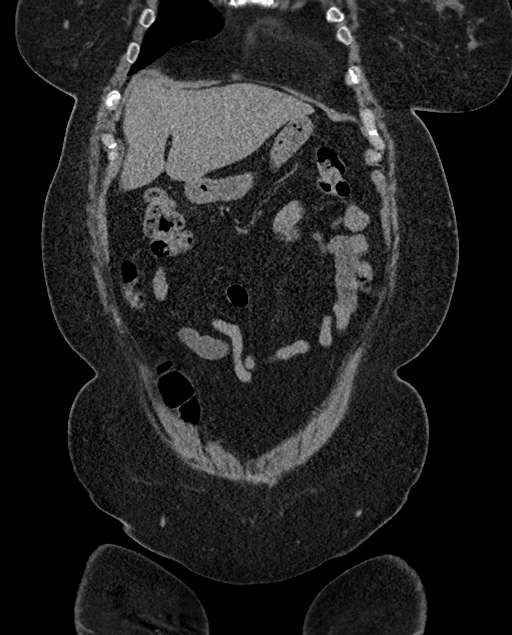
[im 61/136  soft-tissue]
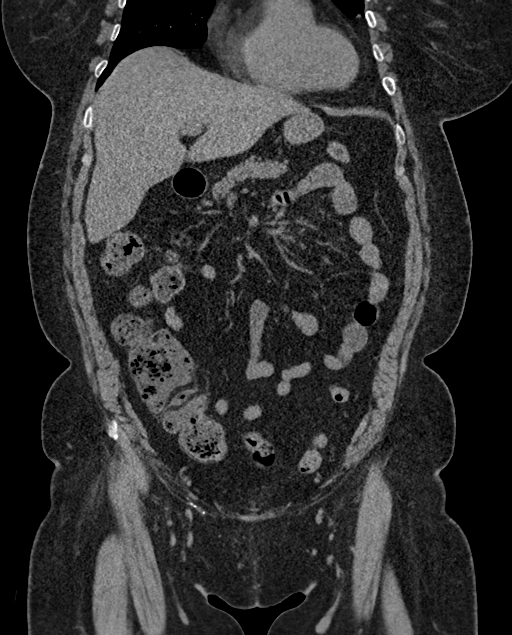
[im 76/136  soft-tissue]
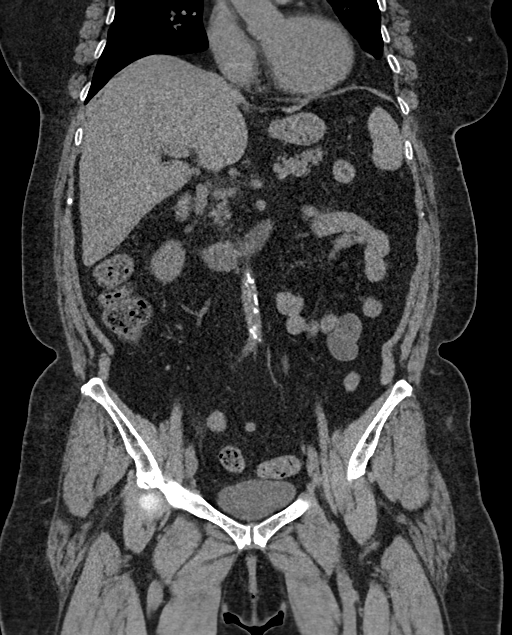

[16 of 46 positions shown; findings below may reference images not displayed]

FINDINGS: Lower chest: Minimal dependent atelectasis of posterior lung bases
are noted. The heart size normal.

Hepatobiliary: No focal liver abnormality is seen. Status post
cholecystectomy. No biliary dilatation.

Pancreas: Unremarkable. No pancreatic ductal dilatation or
surrounding inflammatory changes.

Spleen: Normal in size without focal abnormality.

Adrenals/Urinary Tract: Minimal right hydronephrosis is identified.
There is a 2 mm stone in the mid right ureter image 66 series 3.
There is a 3-4 mm stone in the distal right ureter image 76 series
3. Nonobstructing stones are identified in both kidneys. There is no
left hydronephrosis. The bladder is normal.

Stomach/Bowel: Stomach is within normal limits. Status post prior
appendectomy. No evidence of bowel wall thickening, distention, or
inflammatory changes.

Vascular/Lymphatic: Aortic atherosclerosis. No enlarged abdominal or
pelvic lymph nodes.

Reproductive: Status post hysterectomy. No adnexal masses.

Other: None.

Musculoskeletal: Degenerative joint changes of the spine are noted.
IMPRESSION: 1. Minimal right hydronephrosis with a 2 mm stone in the mid right
ureter. There is a 3-4 mm stone in the distal right ureter.
2. Nonobstructing stones are identified in both kidneys.
3. Aortic atherosclerosis.

Aortic Atherosclerosis ([id number].E).

## 2023-03-24 ENCOUNTER — Encounter: Payer: Self-pay | Admitting: Gastroenterology

## 2023-05-06 ENCOUNTER — Ambulatory Visit (AMBULATORY_SURGERY_CENTER): Payer: Medicare Other | Admitting: *Deleted

## 2023-05-06 ENCOUNTER — Encounter: Payer: Self-pay | Admitting: Gastroenterology

## 2023-05-06 VITALS — Ht 59.0 in | Wt 140.0 lb

## 2023-05-06 DIAGNOSIS — Z86010 Personal history of colonic polyps: Secondary | ICD-10-CM

## 2023-05-06 MED ORDER — NA SULFATE-K SULFATE-MG SULF 17.5-3.13-1.6 GM/177ML PO SOLN
1.0000 | Freq: Once | ORAL | 0 refills | Status: AC
Start: 2023-05-06 — End: 2023-05-06

## 2023-05-06 NOTE — Progress Notes (Signed)
Pt's name and DOB verified at the beginning of the pre-visit.  Pt denies any difficulty with ambulating,sitting, laying down or rolling side to side Gave both [facility] main # and MD on call # prior to instructions.  No egg or soy allergy known to patient  No issues known to pt with past sedation with any surgeries or procedures Pt denies having issues being intubated Pt has no issues moving head neck or swallowing No FH of Malignant Hyperthermia Pt is not on diet pills Pt is not on home 02  Pt is not on blood thinners  Pt denies issues with constipation  Pt is not on dialysis Pt denise any abnormal heart rhythms  Pt denies any upcoming cardiac testing Pt encouraged to use to use Singlecare or Goodrx to reduce cost  Patient's chart reviewed by Cathlyn Parsons CNRA prior to pre-visit and patient appropriate for the [facility].  Pre-visit completed and red dot placed by patient's name on their procedure day (on provider's schedule).  . Visit by phone Pt states weight is 140 lb Instructed pt why it is important to and  to call if they have any changes in health or new medications. Directed them to the # given and on instructions.   Pt states they will.  Instructions reviewed with pt and pt states understanding. Instructed to review again prior to procedure. Pt states they will.  Instructions sent by mail

## 2023-05-17 ENCOUNTER — Other Ambulatory Visit: Payer: Self-pay | Admitting: Internal Medicine

## 2023-05-17 DIAGNOSIS — Z1231 Encounter for screening mammogram for malignant neoplasm of breast: Secondary | ICD-10-CM

## 2023-05-20 ENCOUNTER — Ambulatory Visit (AMBULATORY_SURGERY_CENTER): Payer: Medicare Other | Admitting: Gastroenterology

## 2023-05-20 ENCOUNTER — Encounter: Payer: Self-pay | Admitting: Gastroenterology

## 2023-05-20 VITALS — BP 143/64 | HR 59 | Temp 96.9°F | Resp 18 | Ht 59.0 in | Wt 140.0 lb

## 2023-05-20 DIAGNOSIS — D120 Benign neoplasm of cecum: Secondary | ICD-10-CM

## 2023-05-20 DIAGNOSIS — Z09 Encounter for follow-up examination after completed treatment for conditions other than malignant neoplasm: Secondary | ICD-10-CM | POA: Diagnosis not present

## 2023-05-20 DIAGNOSIS — Z86010 Personal history of colonic polyps: Secondary | ICD-10-CM

## 2023-05-20 DIAGNOSIS — D128 Benign neoplasm of rectum: Secondary | ICD-10-CM

## 2023-05-20 HISTORY — PX: COLONOSCOPY W/ POLYPECTOMY: SHX1380

## 2023-05-20 MED ORDER — SODIUM CHLORIDE 0.9 % IV SOLN: 500.0000 mL | Freq: Once | INTRAVENOUS | Status: DC

## 2023-05-20 NOTE — Progress Notes (Signed)
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  Former patient of Dr. Orvan Falconer.  CHIEF COMPLAINT:  CRC screening, Personal history of colon polyps   HPI: Sonia Jackson is a 67 y.o. 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  Former patient of Dr. Orvan Falconer.   Past Medical History:  Diagnosis Date   Anxiety    Chronic kidney disease    Hard of hearing    History of kidney stones    Osteoporosis    PONV (postoperative nausea and vomiting)    nausea only    Past Surgical History:  Procedure Laterality Date   ABDOMINAL HYSTERECTOMY     APPENDECTOMY     arm surgery Left    BREAST EXCISIONAL BIOPSY Right 2017   benign   BREAST LUMPECTOMY WITH RADIOACTIVE SEED LOCALIZATION Right 08/27/2016   Procedure: RIGHT BREAST LUMPECTOMY WITH RADIOACTIVE SEED LOCALIZATION;  Surgeon: Chevis Pretty III, MD;  Location: [facility];  Service: General;  Laterality: Right;   CARPAL TUNNEL RELEASE Left    CHOLECYSTECTOMY     COLONOSCOPY     CYSTOSCOPY WITH RETROGRADE PYELOGRAM, URETEROSCOPY AND STENT PLACEMENT Right 11/19/2021   Procedure: CYSTOSCOPY WITH RIGHT RETROGRADE PYELOGRAM, URETEROSCOPY WITH  HOLMIUM LASER AND STENT PLACEMENT;  Surgeon: Despina Arias, MD;  Location: [facility];  Service: Urology;  Laterality: Right;   ESOPHAGOGASTRODUODENOSCOPY     FOOT SURGERY Bilateral    KNEE SURGERY Left     Prior to Admission medications   Medication Sig Start Date End Date Taking? Authorizing Provider  phentermine 15 MG capsule Take 30 mg by mouth every morning. 05/11/23  Yes [provider]  alendronate (FOSAMAX) 70 MG tablet Take 70 mg by mouth once a week. 10/10/21   [provider]  ALPRAZolam Prudy Feeler) 0.5 MG tablet  Take 0.5 mg by mouth at bedtime as needed for sleep.    [provider]  Cholecalciferol (VITAMIN D3 PO) Take 1 tablet by mouth daily.    [provider]  fluticasone (FLONASE) 50 MCG/ACT nasal spray Place 1 spray into both nostrils daily. Patient not taking: Reported on 11/17/2021 09/22/21   Coralyn Mark, NP  ondansetron (ZOFRAN) 4 MG tablet Take 1 tablet (4 mg total) by mouth every 6 (six) hours. Patient not taking: Reported on 05/06/2023 09/22/21   Coralyn Mark, NP  tamsulosin (FLOMAX) 0.4 MG CAPS capsule Take 0.4 mg by mouth daily. Patient not taking: Reported on 05/06/2023    [provider]    Current Outpatient Medications  Medication Sig Dispense Refill   phentermine 15 MG capsule Take 30 mg by mouth every morning.     alendronate (FOSAMAX) 70 MG tablet Take 70 mg by mouth once a week.     ALPRAZolam (XANAX) 0.5 MG tablet Take 0.5 mg by mouth at bedtime as needed for sleep.     Cholecalciferol (VITAMIN D3 PO) Take 1 tablet by mouth daily.     fluticasone (FLONASE) 50 MCG/ACT nasal spray Place 1 spray into both nostrils daily. (Patient not taking: Reported on 11/17/2021) 16 g 2   ondansetron (ZOFRAN) 4 MG tablet Take 1 tablet (4 mg total) by mouth every 6 (six) hours. (Patient not taking: Reported on 05/06/2023) 12 tablet 0   tamsulosin (FLOMAX) 0.4 MG CAPS capsule Take  0.4 mg by mouth daily. (Patient not taking: Reported on 05/06/2023)     Current Facility-Administered Medications  Medication Dose Route Frequency Provider Last Rate Last Admin   0.9 %  sodium chloride infusion  500 mL Intravenous Once Meryl Dare, MD        Allergies as of 05/20/2023 - Review Complete 05/20/2023  Allergen Reaction Noted   Compazine Other (See Comments) 06/05/2011   Compazine  [prochlorperazine]  12/03/2019   Sulfa antibiotics  05/06/2023    Family History  Problem Relation Age of Onset   Healthy Mother    Diabetes Father    Breast cancer Neg Hx     Colon cancer Neg Hx    Esophageal cancer Neg Hx    Stomach cancer Neg Hx    Rectal cancer Neg Hx    Colon polyps Neg Hx     Social History   Socioeconomic History   Marital status: Married    Spouse name: Not on file   Number of children: Not on file   Years of education: Not on file   Highest education level: Not on file  Occupational History   Not on file  Tobacco Use   Smoking status: Never   Smokeless tobacco: Never  Vaping Use   Vaping status: Never Used  Substance and Sexual Activity   Alcohol use: No   Drug use: No   Sexual activity: Yes    Birth control/protection: Surgical  Other Topics Concern   Not on file  Social History Narrative   Not on file   Social Determinants of Health   Financial Resource Strain: Not on file  Food Insecurity: Not on file  Transportation Needs: Not on file  Physical Activity: Not on file  Stress: Not on file  Social Connections: Not on file  Intimate Partner Violence: Not on file    Review of Systems:  All systems reviewed were negative except where noted in HPI.   Physical Exam:  General:  Alert, well-developed, in NAD Head:  Normocephalic and atraumatic. Eyes:  Sclera clear, no icterus.   Conjunctiva pink. Ears:  Normal auditory acuity. Mouth:  No deformity or lesions.  Neck:  Supple; no masses. Lungs:  Clear throughout to auscultation.   No wheezes, crackles, or rhonchi.  Heart:  Regular rate and rhythm; no murmurs. Abdomen:  Soft, nondistended, nontender. No masses, hepatomegaly. No palpable masses.  Normal bowel sounds.    Rectal:  Deferred   Msk:  Symmetrical without gross deformities. Extremities:  Without edema. Neurologic:  Alert and  oriented x 4; grossly normal neurologically. Skin:  Intact without significant lesions or rashes. Psych:  Alert and cooperative. Normal mood and affect.   Venita Lick. Russella Dar  05/20/2023, 9:35 AM See Loretha Stapler, [facility], to contact our on call provider

## 2023-05-20 NOTE — Patient Instructions (Signed)
Handout provided about polyps.  Resume previous diet.  Continue present medications.   Await pathology results.  Repeat colonoscopy, likely in 3 years, after studies are complete for surveillance based on pathology results.    YOU HAD AN ENDOSCOPIC PROCEDURE TODAY AT THE [facility]:   Refer to the procedure report that was given to you for any specific questions about what was found during the examination.  If the procedure report does not answer your questions, please call your gastroenterologist to clarify.  If you requested that your care partner not be given the details of your procedure findings, then the procedure report has been included in a sealed envelope for you to review at your convenience later.  YOU SHOULD EXPECT: Some feelings of bloating in the abdomen. Passage of more gas than usual.  Walking can help get rid of the air that was put into your GI tract during the procedure and reduce the bloating. If you had a lower endoscopy (such as a colonoscopy or flexible sigmoidoscopy) you may notice spotting of blood in your stool or on the toilet paper. If you underwent a bowel prep for your procedure, you may not have a normal bowel movement for a few days.  Please Note:  You might notice some irritation and congestion in your nose or some drainage.  This is from the oxygen used during your procedure.  There is no need for concern and it should clear up in a day or so.  SYMPTOMS TO REPORT IMMEDIATELY:  Following lower endoscopy (colonoscopy or flexible sigmoidoscopy):  Excessive amounts of blood in the stool  Significant tenderness or worsening of abdominal pains  Swelling of the abdomen that is new, acute  Fever of 100F or higher  For urgent or emergent issues, a gastroenterologist can be reached at any hour by calling [phone]. Do not use MyChart messaging for urgent concerns.    DIET:  We do recommend a small meal at first, but then you may proceed to your regular  diet.  Drink plenty of fluids but you should avoid alcoholic beverages for 24 hours.  ACTIVITY:  You should plan to take it easy for the rest of today and you should NOT DRIVE or use heavy machinery until tomorrow (because of the sedation medicines used during the test).    FOLLOW UP: Our staff will call the number listed on your records the next business day following your procedure.  We will call around 7:15- 8:00 am to check on you and address any questions or concerns that you may have regarding the information given to you following your procedure. If we do not reach you, we will leave a message.     If any biopsies were taken you will be contacted by phone or by letter within the next 1-3 weeks.  Please call us at [phone] if you have not heard about the biopsies in 3 weeks.    SIGNATURES/CONFIDENTIALITY: You and/or your care partner have signed paperwork which will be entered into your electronic medical record.  These signatures attest to the fact that that the information above on your After Visit Summary has been reviewed and is understood.  Full responsibility of the confidentiality of this discharge information lies with you and/or your care-partner.

## 2023-05-20 NOTE — Progress Notes (Signed)
Sedate, gd SR, tolerated procedure well, VSS, report to RN 

## 2023-05-20 NOTE — Progress Notes (Signed)
Pt's states no medical or surgical changes since previsit or office visit. 

## 2023-05-20 NOTE — Op Note (Signed)
[facility] Patient Name: Sonia Jackson Procedure Date: 05/20/2023 9:39 AM MRN: [id number] Endoscopist: Meryl Dare , MD, [phone] Age: 67 Referring MD:  Date of Birth: 1956/04/09 Gender: Female Account #: [id number] Procedure:                Colonoscopy Indications:              Surveillance: Personal history of adenomatous                            polyps on last colonoscopy 3 years ago Medicines:                Monitored Anesthesia Care Procedure:                Pre-Anesthesia Assessment:                           - Prior to the procedure, a History and Physical                            was performed, and patient medications and                            allergies were reviewed. The patient's tolerance of                            previous anesthesia was also reviewed. The risks                            and benefits of the procedure and the sedation                            options and risks were discussed with the patient.                            All questions were answered, and informed consent                            was obtained. Prior Anticoagulants: The patient has                            taken no anticoagulant or antiplatelet agents. ASA                            Grade Assessment: II - A patient with mild systemic                            disease. After reviewing the risks and benefits,                            the patient was deemed in satisfactory condition to                            undergo the procedure.  After obtaining informed consent, the colonoscope                            was passed under direct vision. Throughout the                            procedure, the patient's blood pressure, pulse, and                            oxygen saturations were monitored continuously. The                            Olympus Scope SN: [id number] was introduced through                            the anus and advanced  to the the cecum, identified                            by appendiceal orifice and ileocecal valve. The                            ileocecal valve, appendiceal orifice, and rectum                            were photographed. The quality of the bowel                            preparation was excellent. The colonoscopy was                            performed without difficulty. The patient tolerated                            the procedure well. Scope In: 9:45:09 AM Scope Out: 10:00:23 AM Scope Withdrawal Time: 0 hours 10 minutes 20 seconds  Total Procedure Duration: 0 hours 15 minutes 14 seconds  Findings:                 The perianal and digital rectal examinations were                            normal.                           A 5 mm polyp was found in the ileocecal valve. The                            polyp was sessile. The polyp was removed with a                            cold snare. Resection and retrieval were complete.                           A 10 mm polyp was found in the distal rectum. The  polyp was sessile. The polyp was removed with a                            cold snare. Resection and retrieval were complete.                           The exam was otherwise without abnormality on                            direct and retroflexion views. Complications:            No immediate complications. Estimated blood loss:                            None. Estimated Blood Loss:     Estimated blood loss: none. Impression:               - One 5 mm polyp at the ileocecal valve, removed                            with a cold snare. Resected and retrieved.                           - One 10 mm polyp in the distal rectum, removed                            with a cold snare. Resected and retrieved.                           - The examination was otherwise normal on direct                            and retroflexion views. Recommendation:           - Repeat  colonoscopy, likely 3 years, after studies                            are complete for surveillance based on pathology                            results.                           - Patient has a contact number available for                            emergencies. The signs and symptoms of potential                            delayed complications were discussed with the                            patient. Return to normal activities tomorrow.  Written discharge instructions were provided to the                            patient.                           - Resume previous diet.                           - Continue present medications.                           - Await pathology results. Meryl Dare, MD 05/20/2023 10:03:43 AM This report has been signed electronically.

## 2023-05-20 NOTE — Progress Notes (Signed)
Called to room to assist during endoscopic procedure.  Patient ID and intended procedure confirmed with present staff. Received instructions for my participation in the procedure from the performing physician.  

## 2023-05-21 ENCOUNTER — Telehealth: Payer: Self-pay

## 2023-05-21 NOTE — Telephone Encounter (Signed)
  Follow up Call-     05/20/2023    9:15 AM  Call back number  Post procedure Call Back phone  # [phone]  Permission to leave phone message Yes     Patient questions:  Do you have a fever, pain , or abdominal swelling? No. Pain Score  0 *  Have you tolerated food without any problems? Yes.    Have you been able to return to your normal activities? Yes.    Do you have any questions about your discharge instructions: Diet   No. Medications  No. Follow up visit  No.  Do you have questions or concerns about your Care? No.  Actions: * If pain score is 4 or above: No action needed, pain <4.

## 2023-06-04 ENCOUNTER — Encounter: Payer: Self-pay | Admitting: Gastroenterology

## 2023-08-02 ENCOUNTER — Ambulatory Visit
Admission: RE | Admit: 2023-08-02 | Discharge: 2023-08-02 | Disposition: A | Discharge status: Home / Self Care | Payer: Medicare Other | Source: Ambulatory Visit | Attending: Internal Medicine

## 2023-08-02 DIAGNOSIS — Z1231 Encounter for screening mammogram for malignant neoplasm of breast: Secondary | ICD-10-CM

## 2023-08-07 ENCOUNTER — Ambulatory Visit (HOSPITAL_COMMUNITY)
Admission: EM | Admit: 2023-08-07 | Discharge: 2023-08-07 | Disposition: A | Discharge status: Home / Self Care | Payer: Medicare Other | Attending: Internal Medicine | Admitting: Internal Medicine

## 2023-08-07 ENCOUNTER — Encounter (HOSPITAL_COMMUNITY): Payer: Self-pay | Admitting: *Deleted

## 2023-08-07 ENCOUNTER — Other Ambulatory Visit: Payer: Self-pay

## 2023-08-07 DIAGNOSIS — R110 Nausea: Secondary | ICD-10-CM

## 2023-08-07 DIAGNOSIS — M545 Low back pain, unspecified: Secondary | ICD-10-CM

## 2023-08-07 DIAGNOSIS — N3001 Acute cystitis with hematuria: Secondary | ICD-10-CM

## 2023-08-07 LAB — POCT URINALYSIS DIP (MANUAL ENTRY)
Bilirubin, UA: NEGATIVE
Glucose, UA: NEGATIVE mg/dL
Ketones, POC UA: NEGATIVE mg/dL
Nitrite, UA: NEGATIVE
Protein Ur, POC: NEGATIVE mg/dL
Spec Grav, UA: 1.02 (ref 1.010–1.025)
Urobilinogen, UA: 0.2 U/dL
pH, UA: 6 (ref 5.0–8.0)

## 2023-08-07 MED ORDER — ACETAMINOPHEN 325 MG PO TABS
ORAL_TABLET | ORAL | Status: AC
  Filled 2023-08-07: qty 3

## 2023-08-07 MED ORDER — ACETAMINOPHEN 325 MG PO TABS
975.0000 mg | ORAL_TABLET | Freq: Once | ORAL | Status: AC
  Administered 2023-08-07: 975 mg via ORAL

## 2023-08-07 MED ORDER — KETOROLAC TROMETHAMINE 30 MG/ML IJ SOLN
15.0000 mg | Freq: Once | INTRAMUSCULAR | Status: AC
  Administered 2023-08-07: 15 mg via INTRAMUSCULAR

## 2023-08-07 MED ORDER — ONDANSETRON 4 MG PO TBDP: 4.0000 mg | ORAL_TABLET | Freq: Three times a day (TID) | ORAL | 0 refills | Status: DC | PRN

## 2023-08-07 MED ORDER — KETOROLAC TROMETHAMINE 30 MG/ML IJ SOLN
INTRAMUSCULAR | Status: AC
  Filled 2023-08-07: qty 1

## 2023-08-07 MED ORDER — CEPHALEXIN 500 MG PO CAPS: 500.0000 mg | ORAL_CAPSULE | Freq: Two times a day (BID) | ORAL | 0 refills | Status: AC

## 2023-08-07 NOTE — Discharge Instructions (Addendum)
Your urine shows you likely have a urinary tract infection.  I have sent your urine for culture to confirm this.  We will call you if we need to change your antibiotic when we find out the type of bacteria growing in your bladder.   Take antibiotic as directed with a snack/food to avoid stomach upset. To avoid GI upset please take this medication with food.  Zofran every 8 hours as needed for nausea and vomiting. You may take Tylenol as needed for pain. I gave you a shot of ketorolac in the clinic which is a strong anti-inflammatory.  This will help with your low back pain associated with urinary tract infection.  Avoid drinking beverages that irritate the urinary tract like sodas, tea, coffee, or juice. Drink plenty of water to stay well hydrated and prevent severe infection.  If you develop any new or worsening symptoms or if your symptoms do not start to improve, pleases return here or follow-up with your primary care provider. If your symptoms are severe, please go to the emergency room.

## 2023-08-07 NOTE — ED Provider Notes (Addendum)
[facility]    CSN: [id number] Arrival date & time: 08/07/23  1000      History   Chief Complaint Chief Complaint  Patient presents with   Urinary Frequency   Dysuria   Back Pain   Abdominal Pain    HPI Sonia Jackson is a 67 y.o. female.   Sonia Jackson is a 67 y.o. female presenting for chief complaint of Urinary Frequency, Urinary Urgency, Dysuria, Back Pain, and Abdominal Pain that started 3 days ago. Reports associated cold chills without documented fever at home. Back pain is localized to the left lower back and is currently 10/10. Pain radiates around the side and into the bilateral lower abdomen. Pain to the low back comes and goes in intensity and is a "throbbing" pain. Reports intermittent nausea without vomiting, no diarrhea or hematuria. History of kidney stones to the right ureter requiring lithotripsy several years ago. States this pain does not feel similar to the last time she had a kidney stone.  No recent trauma/injury to the low back, paresthesias to the lower extremities, or extremity weakness.  Denies frequent intake of urinary irritants and SGLT2 inhibitor use.  She is not diabetic.  She has not attempted use of any OTC medications prior to arrival for symptoms.   Urinary Frequency Associated symptoms include abdominal pain.  Dysuria Associated symptoms: abdominal pain   Back Pain Associated symptoms: abdominal pain and dysuria   Abdominal Pain Associated symptoms: dysuria     Past Medical History:  Diagnosis Date   Anxiety    Chronic kidney disease    Hard of hearing    History of kidney stones    Osteoporosis    PONV (postoperative nausea and vomiting)    nausea only    Patient Active Problem List   Diagnosis Date Noted   Sclerosing adenosis of breast, right 10/01/2016    Past Surgical History:  Procedure Laterality Date   ABDOMINAL HYSTERECTOMY     APPENDECTOMY     arm surgery Left    BREAST EXCISIONAL BIOPSY  Right 2017   benign   BREAST LUMPECTOMY WITH RADIOACTIVE SEED LOCALIZATION Right 08/27/2016   Procedure: RIGHT BREAST LUMPECTOMY WITH RADIOACTIVE SEED LOCALIZATION;  Surgeon: Chevis Pretty III, MD;  Location: [facility];  Service: General;  Laterality: Right;   CARPAL TUNNEL RELEASE Left    CHOLECYSTECTOMY     COLONOSCOPY     COLONOSCOPY W/ POLYPECTOMY  05/20/2023   MS at [facility]   CYSTOSCOPY WITH RETROGRADE PYELOGRAM, URETEROSCOPY AND STENT PLACEMENT Right 11/19/2021   Procedure: CYSTOSCOPY WITH RIGHT RETROGRADE PYELOGRAM, URETEROSCOPY WITH  HOLMIUM LASER AND STENT PLACEMENT;  Surgeon: Despina Arias, MD;  Location: [facility];  Service: Urology;  Laterality: Right;   ESOPHAGOGASTRODUODENOSCOPY     FOOT SURGERY Bilateral    KNEE SURGERY Left     OB History   No obstetric history on file.      Home Medications    Prior to Admission medications   Medication Sig Start Date End Date Taking? Authorizing Provider  alendronate (FOSAMAX) 70 MG tablet Take 70 mg by mouth once a week. 10/10/21  Yes [provider]  ALPRAZolam Prudy Feeler) 0.5 MG tablet Take 0.5 mg by mouth at bedtime as needed for sleep.   Yes [provider]  cephALEXin (KEFLEX) 500 MG capsule Take 1 capsule (500 mg total) by mouth 2 (two) times daily for 7 days. 08/07/23 08/14/23 Yes Carlisle Beers, FNP  Cholecalciferol (VITAMIN D3 PO) Take  1 tablet by mouth daily.   Yes [provider]  ondansetron (ZOFRAN-ODT) 4 MG disintegrating tablet Take 1 tablet (4 mg total) by mouth every 8 (eight) hours as needed for nausea or vomiting. 08/07/23  Yes Carlisle Beers, FNP  phentermine 15 MG capsule Take 30 mg by mouth every morning. 05/11/23  Yes [provider]  fluticasone (FLONASE) 50 MCG/ACT nasal spray Place 1 spray into both nostrils daily. Patient not taking: Reported on 11/17/2021 09/22/21   Coralyn Mark, NP  ondansetron (ZOFRAN) 4 MG tablet Take 1 tablet (4 mg total) by mouth every 6 (six)  hours. Patient not taking: Reported on 05/06/2023 09/22/21   Coralyn Mark, NP  tamsulosin (FLOMAX) 0.4 MG CAPS capsule Take 0.4 mg by mouth daily. Patient not taking: Reported on 05/06/2023    [provider]    Family History Family History  Problem Relation Age of Onset   Healthy Mother    Diabetes Father    Breast cancer Neg Hx    Colon cancer Neg Hx    Esophageal cancer Neg Hx    Stomach cancer Neg Hx    Rectal cancer Neg Hx    Colon polyps Neg Hx     Social History Social History   Tobacco Use   Smoking status: Never   Smokeless tobacco: Never  Vaping Use   Vaping status: Never Used  Substance Use Topics   Alcohol use: No   Drug use: No     Allergies   Compazine, Compazine  [prochlorperazine], and Sulfa antibiotics   Review of Systems Review of Systems  Gastrointestinal:  Positive for abdominal pain.  Genitourinary:  Positive for dysuria and frequency.  Musculoskeletal:  Positive for back pain.  Per HPI   Physical Exam Triage Vital Signs ED Triage Vitals  Encounter Vitals Group     BP 08/07/23 1020 137/77     Systolic BP Percentile --      Diastolic BP Percentile --      Pulse Rate 08/07/23 1020 60     Resp 08/07/23 1020 16     Temp 08/07/23 1020 97.8 F (36.6 C)     Temp src --      SpO2 08/07/23 1020 97 %     Weight --      Height --      Head Circumference --      Peak Flow --      Pain Score 08/07/23 1017 10     Pain Loc --      Pain Education --      Exclude from Growth Chart --    No data found.  Updated Vital Signs BP 137/77   Pulse 60   Temp 97.8 F (36.6 C)   Resp 16   SpO2 97%   Visual Acuity Right Eye Distance:   Left Eye Distance:   Bilateral Distance:    Right Eye Near:   Left Eye Near:    Bilateral Near:     Physical Exam Vitals and nursing note reviewed.  Constitutional:      Appearance: She is not ill-appearing or toxic-appearing.  HENT:     Head: Normocephalic and atraumatic.     Right Ear:  Hearing and external ear normal.     Left Ear: Hearing and external ear normal.     Nose: Nose normal.     Mouth/Throat:     Lips: Pink.  Eyes:     General: Lids are normal. Vision  grossly intact. Gaze aligned appropriately.     Extraocular Movements: Extraocular movements intact.     Conjunctiva/sclera: Conjunctivae normal.  Cardiovascular:     Rate and Rhythm: Normal rate and regular rhythm.     Heart sounds: Normal heart sounds, S1 normal and S2 normal.  Pulmonary:     Effort: Pulmonary effort is normal. No respiratory distress.     Breath sounds: Normal breath sounds and air entry.  Abdominal:     General: Abdomen is flat. Bowel sounds are normal.     Palpations: Abdomen is soft.     Tenderness: There is no abdominal tenderness. There is no right CVA tenderness, left CVA tenderness or guarding.  Musculoskeletal:     Cervical back: Neck supple.  Skin:    General: Skin is warm and dry.     Capillary Refill: Capillary refill takes less than 2 seconds.     Findings: No rash.  Neurological:     General: No focal deficit present.     Mental Status: She is alert and oriented to person, place, and time. Mental status is at baseline.     Cranial Nerves: No dysarthria or facial asymmetry.  Psychiatric:        Mood and Affect: Mood normal.        Speech: Speech normal.        Behavior: Behavior normal.        Thought Content: Thought content normal.        Judgment: Judgment normal.      UC Treatments / Results  Labs (all labs ordered are listed, but only abnormal results are displayed) Labs Reviewed  POCT URINALYSIS DIP (MANUAL ENTRY) - Abnormal; Notable for the following components:      Result Value   Blood, UA trace-lysed (*)    Leukocytes, UA Small (1+) (*)    All other components within normal limits  URINE CULTURE    EKG   Radiology No results found.  Procedures Procedures (including critical care time)  Medications Ordered in UC Medications  ketorolac  (TORADOL) 30 MG/ML injection 15 mg (has no administration in time range)  acetaminophen (TYLENOL) tablet 975 mg (has no administration in time range)    Initial Impression / Assessment and Plan / UC Course  I have reviewed the triage vital signs and the nursing notes.  Pertinent labs & imaging results that were available during my care of the patient were reviewed by me and considered in my medical decision making (see chart for details).   1.  Acute cystitis with hematuria Presentation is consistent with acute uncomplicated cystitis.   Will treat with Keflex antibiotic as prescribed.  Reviewed most recent CMP from 2022 showing stable renal function. Urine culture pending. Low suspicion for acute pyelonephritis, kidney stone or infected stone. Appears well hydrated, therefore will defer labs/imaging.  Patient to push fluids to stay well hydrated and reduce intake of known urinary irritants. Discussed methods of preventing future UTI.   Treating low back discomfort in clinic with ketorolac 15 mg IM and oral Tylenol.  May use Zofran at home as needed for nausea and vomiting.  Counseled patient on potential for adverse effects with medications prescribed/recommended today, strict ER and return-to-clinic precautions discussed, patient verbalized understanding.    Final Clinical Impressions(s) / UC Diagnoses   Final diagnoses:  Acute cystitis with hematuria     Discharge Instructions      Your urine shows you likely have a urinary tract infection.  I have  sent your urine for culture to confirm this.  We will call you if we need to change your antibiotic when we find out the type of bacteria growing in your bladder.   Take antibiotic as directed with a snack/food to avoid stomach upset. To avoid GI upset please take this medication with food.  Zofran every 8 hours as needed for nausea and vomiting. You may take Tylenol as needed for pain. I gave you a shot of ketorolac in the clinic  which is a strong anti-inflammatory.  This will help with your low back pain associated with urinary tract infection.  Avoid drinking beverages that irritate the urinary tract like sodas, tea, coffee, or juice. Drink plenty of water to stay well hydrated and prevent severe infection.  If you develop any new or worsening symptoms or if your symptoms do not start to improve, pleases return here or follow-up with your primary care provider. If your symptoms are severe, please go to the emergency room.     ED Prescriptions     Medication Sig Dispense Auth. Provider   cephALEXin (KEFLEX) 500 MG capsule Take 1 capsule (500 mg total) by mouth 2 (two) times daily for 7 days. 14 capsule Reita May M, FNP   ondansetron (ZOFRAN-ODT) 4 MG disintegrating tablet Take 1 tablet (4 mg total) by mouth every 8 (eight) hours as needed for nausea or vomiting. 20 tablet Carlisle Beers, FNP      PDMP not reviewed this encounter.   Carlisle Beers, FNP 08/07/23 1049    Carlisle Beers, FNP 08/07/23 1050

## 2023-08-07 NOTE — ED Triage Notes (Signed)
Pt reports for 3 days she has had back pain ,ABD pain,dysuria and frequency.

## 2023-08-08 LAB — URINE CULTURE: Culture: >=100000 — AB

## 2024-05-16 ENCOUNTER — Other Ambulatory Visit: Payer: Self-pay | Admitting: Internal Medicine

## 2024-05-16 DIAGNOSIS — Z1231 Encounter for screening mammogram for malignant neoplasm of breast: Secondary | ICD-10-CM

## 2024-08-02 ENCOUNTER — Ambulatory Visit

## 2024-08-03 ENCOUNTER — Ambulatory Visit
Admission: RE | Admit: 2024-08-03 | Discharge: 2024-08-03 | Disposition: A | Discharge status: Home / Self Care | Source: Ambulatory Visit | Attending: Internal Medicine | Admitting: Internal Medicine

## 2024-08-03 ENCOUNTER — Ambulatory Visit

## 2024-08-03 DIAGNOSIS — Z1231 Encounter for screening mammogram for malignant neoplasm of breast: Secondary | ICD-10-CM

## 2024-08-21 ENCOUNTER — Encounter: Payer: Self-pay | Admitting: Physician Assistant

## 2024-08-27 ENCOUNTER — Other Ambulatory Visit: Payer: Self-pay

## 2024-08-27 ENCOUNTER — Encounter (HOSPITAL_COMMUNITY): Payer: Self-pay

## 2024-08-27 ENCOUNTER — Emergency Department (HOSPITAL_COMMUNITY)
Admission: EM | Admit: 2024-08-27 | Discharge: 2024-08-27 | Disposition: A | Discharge status: Home / Self Care | Attending: Emergency Medicine | Admitting: Emergency Medicine

## 2024-08-27 ENCOUNTER — Emergency Department (HOSPITAL_COMMUNITY)

## 2024-08-27 DIAGNOSIS — N132 Hydronephrosis with renal and ureteral calculous obstruction: Secondary | ICD-10-CM | POA: Insufficient documentation

## 2024-08-27 DIAGNOSIS — M549 Dorsalgia, unspecified: Secondary | ICD-10-CM | POA: Diagnosis present

## 2024-08-27 DIAGNOSIS — N201 Calculus of ureter: Secondary | ICD-10-CM

## 2024-08-27 LAB — URINALYSIS, ROUTINE W REFLEX MICROSCOPIC
Bilirubin Urine: NEGATIVE
Glucose, UA: NEGATIVE mg/dL
Ketones, ur: NEGATIVE mg/dL
Nitrite: NEGATIVE
Protein, ur: NEGATIVE mg/dL
Specific Gravity, Urine: 1.015 (ref 1.005–1.030)
pH: 6 (ref 5.0–8.0)

## 2024-08-27 LAB — COMPREHENSIVE METABOLIC PANEL WITH GFR
ALT: 15 U/L (ref 0–44)
AST: 21 U/L (ref 15–41)
Albumin: 3.6 g/dL (ref 3.5–5.0)
Alkaline Phosphatase: 69 U/L (ref 38–126)
Anion gap: 11 (ref 5–15)
BUN: 14 mg/dL (ref 8–23)
CO2: 25 mmol/L (ref 22–32)
Calcium: 9.2 mg/dL (ref 8.9–10.3)
Chloride: 103 mmol/L (ref 98–111)
Creatinine, Ser: 0.88 mg/dL (ref 0.44–1.00)
GFR, Estimated: >60 mL/min (ref >60)
Glucose, Bld: 103 mg/dL — ABNORMAL HIGH (ref 70–99)
Potassium: 4.1 mmol/L (ref 3.5–5.1)
Sodium: 139 mmol/L (ref 135–145)
Total Bilirubin: 1 mg/dL (ref 0.0–1.2)
Total Protein: 7 g/dL (ref 6.5–8.1)

## 2024-08-27 LAB — CBC
HCT: 43.8 % (ref 36.0–46.0)
Hemoglobin: 14.6 g/dL (ref 12.0–15.0)
MCH: 30.3 pg (ref 26.0–34.0)
MCHC: 33.3 g/dL (ref 30.0–36.0)
MCV: 90.9 fL (ref 80.0–100.0)
Platelets: 208 K/uL (ref 150–400)
RBC: 4.82 MIL/uL (ref 3.87–5.11)
RDW: 12.5 % (ref 11.5–15.5)
WBC: 8.1 K/uL (ref 4.0–10.5)
nRBC: 0 % (ref 0.0–0.2)

## 2024-08-27 MED ORDER — MORPHINE SULFATE (PF) 4 MG/ML IV SOLN
4.0000 mg | Freq: Once | INTRAVENOUS | Status: AC
  Administered 2024-08-27: 4 mg via INTRAVENOUS
  Filled 2024-08-27: qty 1

## 2024-08-27 MED ORDER — OXYCODONE-ACETAMINOPHEN 5-325 MG PO TABS
1.0000 | ORAL_TABLET | Freq: Four times a day (QID) | ORAL | 0 refills | Status: AC | PRN
Start: 2024-08-27 — End: ?

## 2024-08-27 MED ORDER — SODIUM CHLORIDE 0.9 % IV BOLUS
500.0000 mL | Freq: Once | INTRAVENOUS | Status: AC
  Administered 2024-08-27: 500 mL via INTRAVENOUS

## 2024-08-27 MED ORDER — ONDANSETRON 4 MG PO TBDP
4.0000 mg | ORAL_TABLET | Freq: Once | ORAL | Status: AC | PRN
  Administered 2024-08-27: 4 mg via ORAL
  Filled 2024-08-27: qty 1

## 2024-08-27 MED ORDER — ONDANSETRON HCL 4 MG/2ML IJ SOLN
4.0000 mg | Freq: Once | INTRAMUSCULAR | Status: DC
  Filled 2024-08-27: qty 2

## 2024-08-27 MED ORDER — ONDANSETRON 4 MG PO TBDP: 4.0000 mg | ORAL_TABLET | Freq: Three times a day (TID) | ORAL | 0 refills | Status: AC | PRN

## 2024-08-27 MED ORDER — TAMSULOSIN HCL 0.4 MG PO CAPS: 0.4000 mg | ORAL_CAPSULE | Freq: Every day | ORAL | 0 refills | Status: DC

## 2024-08-27 MED ORDER — IBUPROFEN 400 MG PO TABS
400.0000 mg | ORAL_TABLET | Freq: Once | ORAL | Status: AC | PRN
  Administered 2024-08-27: 400 mg via ORAL
  Filled 2024-08-27: qty 1

## 2024-08-27 NOTE — ED Notes (Signed)
 Awaiting pt from lobby

## 2024-08-27 NOTE — ED Provider Notes (Signed)
 [place] [facility] AT [facility] Provider Note   CSN: [id number] Arrival date & time: 08/27/24  1036     Patient presents with: Back Pain   Sonia Jackson is a 68 y.o. female.  She is brought in by family for right sided back pain that has been going on for 3 to 4 days.  Associated with vomiting.  Urinary frequency.  No hematuria.  No fever.  She has a history of kidney stones but does not recall what they feel like.  No trauma.  No radiation to her legs or abdomen.  No numbness or weakness.  Has not tried anything for it.  Rates it 10 out of 10  {Add pertinent medical, surgical, social history, OB history to YEP:67052} The history is provided by the patient.  Flank Pain This is a new problem. The current episode started more than 2 days ago. The problem occurs constantly. The problem has not changed since onset.Pertinent negatives include no chest pain, no abdominal pain, no headaches and no shortness of breath. Nothing aggravates the symptoms. Nothing relieves the symptoms. She has tried nothing for the symptoms. The treatment provided no relief.       Prior to Admission medications   Medication Sig Start Date End Date Taking? Authorizing Provider  alendronate (FOSAMAX) 70 MG tablet Take 70 mg by mouth once a week. 10/10/21   [provider]  ALPRAZolam (XANAX) 0.5 MG tablet Take 0.5 mg by mouth at bedtime as needed for sleep.    [provider]  Cholecalciferol (VITAMIN D3 PO) Take 1 tablet by mouth daily.    [provider]  fluticasone  (FLONASE ) 50 MCG/ACT nasal spray Place 1 spray into both nostrils daily. Patient not taking: Reported on 11/17/2021 09/22/21   Merilee Andrea CROME, NP  ondansetron  (ZOFRAN ) 4 MG tablet Take 1 tablet (4 mg total) by mouth every 6 (six) hours. Patient not taking: Reported on 05/06/2023 09/22/21   Merilee Andrea CROME, NP  ondansetron  (ZOFRAN -ODT) 4 MG disintegrating tablet Take 1 tablet (4 mg total)  by mouth every 8 (eight) hours as needed for nausea or vomiting. 08/07/23   Enedelia Dorna HERO, FNP  phentermine 15 MG capsule Take 30 mg by mouth every morning. 05/11/23   [provider]  tamsulosin  (FLOMAX ) 0.4 MG CAPS capsule Take 0.4 mg by mouth daily. Patient not taking: Reported on 05/06/2023    [provider]    Allergies: Compazine, Compazine  [prochlorperazine], and Sulfa antibiotics    Review of Systems  Constitutional:  Positive for chills. Negative for fever.  HENT:  Negative for sore throat.   Respiratory:  Negative for shortness of breath.   Cardiovascular:  Negative for chest pain.  Gastrointestinal:  Positive for nausea and vomiting. Negative for abdominal pain.  Genitourinary:  Positive for flank pain and frequency. Negative for dysuria.  Musculoskeletal:  Positive for back pain.  Neurological:  Negative for weakness, numbness and headaches.    Updated Vital Signs BP (!) 141/60   Pulse 81   Temp 98.1 F (36.7 C)   Resp 14   Ht 4' 11 (1.499 m)   Wt 62.6 kg   SpO2 100%   BMI 27.87 kg/m   Physical Exam Vitals and nursing note reviewed.  Constitutional:      General: She is not in acute distress.    Appearance: Normal appearance. She is well-developed.  HENT:     Head: Normocephalic and atraumatic.  Eyes:  Conjunctiva/sclera: Conjunctivae normal.  Cardiovascular:     Rate and Rhythm: Normal rate and regular rhythm.     Heart sounds: No murmur heard. Pulmonary:     Effort: Pulmonary effort is normal. No respiratory distress.     Breath sounds: Normal breath sounds.  Abdominal:     Palpations: Abdomen is soft.     Tenderness: There is no abdominal tenderness. There is no guarding or rebound.  Musculoskeletal:        General: No deformity.     Cervical back: Neck supple.  Skin:    General: Skin is warm and dry.     Capillary Refill: Capillary refill takes less than 2 seconds.  Neurological:     General: No focal deficit  present.     Mental Status: She is alert.     Motor: No weakness.     Gait: Gait normal.     (all labs ordered are listed, but only abnormal results are displayed) Labs Reviewed  URINALYSIS, ROUTINE W REFLEX MICROSCOPIC - Abnormal; Notable for the following components:      Result Value   Hgb urine dipstick SMALL (*)    Leukocytes,Ua MODERATE (*)    Bacteria, UA RARE (*)    All other components within normal limits  CBC  COMPREHENSIVE METABOLIC PANEL WITH GFR    EKG: None  Radiology: No results found.  {Document cardiac monitor, telemetry assessment procedure when appropriate:32947} Procedures   Medications Ordered in the ED  sodium chloride  0.9 % bolus 500 mL (has no administration in time range)  ondansetron  (ZOFRAN ) injection 4 mg (has no administration in time range)  morphine  (PF) 4 MG/ML injection 4 mg (has no administration in time range)  ondansetron  (ZOFRAN -ODT) disintegrating tablet 4 mg (4 mg Oral Given 08/27/24 1051)  ibuprofen  (ADVIL ) tablet 400 mg (400 mg Oral Given 08/27/24 1051)      {Click here for ABCD2, HEART and other calculators REFRESH Note before signing:1}                              Medical Decision Making Amount and/or Complexity of Data Reviewed Labs: ordered.  Risk Prescription drug management.   This patient complains of ***; this involves an extensive number of treatment Options and is a complaint that carries with it a high risk of complications and morbidity. The differential includes ***  I ordered, reviewed and interpreted labs, which included *** I ordered medication *** and reviewed PMP when indicated. I ordered imaging studies which included *** and I independently    visualized and interpreted imaging which showed *** Additional history obtained from *** Previous records obtained and reviewed *** I consulted *** and discussed lab and imaging findings and discussed disposition.  Cardiac monitoring reviewed, *** Social  determinants considered, *** Critical Interventions: ***  After the interventions stated above, I reevaluated the patient and found *** Admission and further testing considered, ***   {Document critical care time when appropriate  Document review of labs and clinical decision tools ie CHADS2VASC2, etc  Document your independent review of radiology images and any outside records  Document your discussion with family members, caretakers and with consultants  Document social determinants of health affecting pt's care  Document your decision making why or why not admission, treatments were needed:32947:::1}   Final diagnoses:  None    ED Discharge Orders     None

## 2024-08-27 NOTE — Discharge Instructions (Signed)
 Please drink plenty of fluids.  Continue ibuprofen .  We are prescribing you some narcotic pain medicine and medicine to help the stone pass.  Contact your urologist on Monday.  Return if uncontrolled pain or any fever.

## 2024-08-27 NOTE — ED Notes (Signed)
 Patient transported to CT

## 2024-08-27 NOTE — ED Triage Notes (Signed)
 Pt c.o right lower back pain that sometimes radiates into her groin with associated n/v. Pt has hx of kidney stones and states it feels similar. Pt denies hematuria.

## 2024-08-27 NOTE — ED Notes (Signed)
 Awaiting patient from lobby.

## 2024-09-01 ENCOUNTER — Other Ambulatory Visit: Payer: Self-pay | Admitting: Urology

## 2024-09-25 NOTE — Patient Instructions (Signed)
 SURGICAL WAITING ROOM VISITATION Patients having surgery or a procedure may have no more than 2 support people in the waiting area - these visitors may rotate in the visitor waiting room.   Due to an increase in RSV and influenza rates and associated hospitalizations, children ages 42 and under may not visit patients in [facility] hospitals. If the patient needs to stay at the hospital during part of their recovery, the visitor guidelines for inpatient rooms apply.  PRE-OP VISITATION  Pre-op nurse will coordinate an appropriate time for 1 support person to accompany the patient in pre-op.  This support person may not rotate.  This visitor will be contacted when the time is appropriate for the visitor to come back in the pre-op area.  Please refer to the [facility] website for the visitor guidelines for Inpatients (after your surgery is over and you are in a regular room).  You are not required to quarantine at this time prior to your surgery. However, you must do this: Hand Hygiene often Do NOT share personal items Notify your provider if you are in close contact with someone who has COVID or you develop fever 100.4 or greater, new onset of sneezing, cough, sore throat, shortness of breath or body aches.  If you test positive for Covid or have been in contact with anyone that has tested positive in the last 10 days please notify you surgeon.    Your procedure is scheduled on:  10/03/24  Report to [facility] Main Entrance: [place] entrance where the [employer] is available.   Report to admitting at: 12:00 PM  Call this number if you have any questions or problems the morning of surgery [phone]  FOLLOW ANY ADDITIONAL PRE OP INSTRUCTIONS YOU RECEIVED FROM YOUR SURGEON'S OFFICE!!!  Do not eat food after Midnight the night prior to your surgery/procedure.  After Midnight you may have the following liquids until: 11:00 AM DAY OF SURGERY  Clear Liquid Diet Water Black  Coffee (sugar ok, NO MILK/CREAM OR CREAMERS)  Tea (sugar ok, NO MILK/CREAM OR CREAMERS) regular and decaf                             Plain Jell-O  with no fruit (NO RED)                                           Fruit ices (not with fruit pulp, NO RED)                                     Popsicles (NO RED)                                                                  Juice: NO CITRUS JUICES: only apple, WHITE grape, WHITE cranberry Sports drinks like Gatorade or Powerade (NO RED)   Oral Hygiene is also important to reduce your risk of infection.        Remember - BRUSH YOUR TEETH THE MORNING OF SURGERY WITH YOUR REGULAR TOOTHPASTE  Do NOT smoke after Midnight the night before surgery.  STOP TAKING all Vitamins, Herbs and supplements 1 week before your surgery.   Take ONLY these medicines the morning of surgery with A SIP OF WATER: NONE                   You may not have any metal on your body including hair pins, jewelry, and body piercing  Do not wear make-up, lotions, powders, perfumes / cologne, or deodorant  Do not wear nail polish including gel and S&S, artificial / acrylic nails, or any other type of covering on natural nails including finger and toenails. If you have artificial nails, gel coating, etc., that needs to be removed by a nail salon, Please have this removed prior to surgery. Not doing so may mean that your surgery could be cancelled or delayed if the Surgeon or anesthesia staff feels like they are unable to monitor you safely.   Do not shave 48 hours prior to surgery to avoid nicks in your skin which may contribute to postoperative infections.   Contacts, Hearing Aids, dentures or bridgework may not be worn into surgery. DENTURES WILL BE REMOVED PRIOR TO SURGERY PLEASE DO NOT APPLY Poly grip OR ADHESIVES!!!  You may bring a small overnight bag with you on the day of surgery, only pack items that are not valuable. [facility] IS NOT RESPONSIBLE   FOR VALUABLES THAT  ARE LOST OR STOLEN.   Patients discharged on the day of surgery will not be allowed to drive home.  Someone NEEDS to stay with you for the first 24 hours after anesthesia.  Do not bring your home medications to the hospital. The Pharmacy will dispense medications listed on your medication list to you during your admission in the Hospital.  Special Instructions: Bring a copy of your healthcare power of attorney and living will documents the day of surgery, if you wish to have them scanned into your [facility]- EPIC  Please read over the following fact sheets you were given: IF YOU HAVE QUESTIONS ABOUT YOUR PRE-OP INSTRUCTIONS, PLEASE CALL [phone]   [facility] - Preparing for Surgery      Before surgery, you can play an important role.  Because skin is not sterile, your skin needs to be as free of germs as possible.  You can reduce the number of germs on your skin by washing with CHG (chlorahexidine gluconate) soap before surgery.  CHG is an antiseptic cleaner which kills germs and bonds with the skin to continue killing germs even after washing. Please DO NOT use if you have an allergy to CHG or antibacterial soaps.  If your skin becomes reddened/irritated stop using the CHG and inform your nurse when you arrive at Short Stay. Do not shave (including legs and underarms) for at least 48 hours prior to the first CHG shower.  You may shave your face/neck.  Please follow these instructions carefully:  1.  Shower with CHG Soap the night before surgery ONLY (DO NOT USE THE SOAP THE MORNING OF SURGERY).  2.  If you choose to wash your hair, wash your hair first as usual with your normal  shampoo.  3.  After you shampoo, rinse your hair and body thoroughly to remove the shampoo.                             4.  Use CHG as you would  any other liquid soap.  You can apply chg directly to the skin and wash.  Gently with a scrungie or clean washcloth.  5.  Apply the CHG Soap to your body  ONLY FROM THE NECK DOWN.   Do not use on face/ open                           Wound or open sores. Avoid contact with eyes, ears mouth and genitals (private parts).                       Wash face,  Genitals (private parts) with your normal soap.             6.  Wash thoroughly, paying special attention to the area where your  surgery  will be performed.  7.  Thoroughly rinse your body with warm water from the neck down.  8.  DO NOT shower/wash with your normal soap after using and rinsing off the CHG Soap.                9.  Pat yourself dry with a clean towel.            10.  Wear clean pajamas.            11.  Place clean sheets on your bed the night of your first shower and do not  sleep with pets.  Day of Surgery : Do not apply any CHG, lotions/deodorants the morning of surgery.  Please wear clean clothes to the hospital/surgery center.   FAILURE TO FOLLOW THESE INSTRUCTIONS MAY RESULT IN THE CANCELLATION OF YOUR SURGERY  PATIENT SIGNATURE_________________________________  NURSE SIGNATURE__________________________________  ________________________________________________________________________

## 2024-09-26 ENCOUNTER — Other Ambulatory Visit: Payer: Self-pay

## 2024-09-26 ENCOUNTER — Encounter (HOSPITAL_COMMUNITY)
Admission: RE | Admit: 2024-09-26 | Discharge: 2024-09-26 | Disposition: A | Source: Ambulatory Visit | Attending: Urology

## 2024-09-26 ENCOUNTER — Encounter (HOSPITAL_COMMUNITY): Payer: Self-pay

## 2024-09-26 VITALS — BP 157/78 | HR 82 | Temp 98.3°F | Ht 59.0 in | Wt 139.0 lb

## 2024-09-26 DIAGNOSIS — Z01818 Encounter for other preprocedural examination: Secondary | ICD-10-CM | POA: Insufficient documentation

## 2024-09-26 LAB — CBC
HCT: 41.5 % (ref 36.0–46.0)
Hemoglobin: 13.6 g/dL (ref 12.0–15.0)
MCH: 30.4 pg (ref 26.0–34.0)
MCHC: 32.8 g/dL (ref 30.0–36.0)
MCV: 92.6 fL (ref 80.0–100.0)
Platelets: 215 K/uL (ref 150–400)
RBC: 4.48 MIL/uL (ref 3.87–5.11)
RDW: 12.8 % (ref 11.5–15.5)
WBC: 7.1 K/uL (ref 4.0–10.5)
nRBC: 0 % (ref 0.0–0.2)

## 2024-09-26 NOTE — Progress Notes (Signed)
 For Anesthesia: PCP - Valentin Skates, DO  Cardiologist -   Bowel Prep reminder:N/A  Chest x-ray -  EKG -  Stress Test -  ECHO -  Cardiac Cath -  Pacemaker/ICD device last checked: Pacemaker orders received: Device Rep notified:  Spinal Cord Stimulator:N/A  Sleep Study - N/A CPAP -   Fasting Blood Sugar - N/A Checks Blood Sugar _____ times a day Date and result of last Hgb A1c-  Last dose of GLP1 agonist- N/A GLP1 instructions: Hold 7 days prior to schedule (Hold 24 hours-daily)   Last dose of SGLT-2 inhibitors- N/A SGLT-2 instructions: Hold 72 hours prior to surgery  Blood Thinner Instructions:N/A Last Dose: Time last taken:  Aspirin Instructions:N/A Last Dose: Time last taken:  Activity level: Can go up a flight of stairs and activities of daily living without stopping and without chest pain and/or shortness of breath   Able to exercise without chest pain and/or shortness of breath     Anesthesia review:   Patient denies shortness of breath, fever, cough and chest pain at PAT appointment   Patient verbalized understanding of instructions that were reviewed over the telephone.

## 2024-10-03 ENCOUNTER — Encounter (HOSPITAL_COMMUNITY): Admission: RE | Disposition: A | Payer: Self-pay | Source: Home / Self Care | Attending: Urology

## 2024-10-03 ENCOUNTER — Ambulatory Visit (HOSPITAL_COMMUNITY): Admitting: Anesthesiology

## 2024-10-03 ENCOUNTER — Encounter (HOSPITAL_COMMUNITY): Payer: Self-pay | Admitting: Urology

## 2024-10-03 ENCOUNTER — Other Ambulatory Visit: Payer: Self-pay

## 2024-10-03 ENCOUNTER — Ambulatory Visit (HOSPITAL_COMMUNITY)

## 2024-10-03 ENCOUNTER — Ambulatory Visit (HOSPITAL_COMMUNITY): Payer: Self-pay | Admitting: Medical

## 2024-10-03 ENCOUNTER — Ambulatory Visit (HOSPITAL_COMMUNITY): Admission: RE | Admit: 2024-10-03 | Discharge: 2024-10-03 | Disposition: A | Attending: Urology | Admitting: Urology

## 2024-10-03 HISTORY — PX: CYSTOSCOPY/URETEROSCOPY/HOLMIUM LASER/STENT PLACEMENT: SHX6546

## 2024-10-03 SURGERY — CYSTOSCOPY/URETEROSCOPY/HOLMIUM LASER/STENT PLACEMENT
Anesthesia: General | Site: Pelvis | Laterality: Right

## 2024-10-03 MED ORDER — MIDAZOLAM HCL 2 MG/2ML IJ SOLN
INTRAMUSCULAR | Status: AC
  Filled 2024-10-03: qty 2

## 2024-10-03 MED ORDER — AMISULPRIDE (ANTIEMETIC) 5 MG/2ML IV SOLN: 10.0000 mg | Freq: Once | INTRAVENOUS | Status: DC | PRN

## 2024-10-03 MED ORDER — CEFAZOLIN SODIUM-DEXTROSE 2-4 GM/100ML-% IV SOLN
2.0000 g | INTRAVENOUS | Status: AC
  Administered 2024-10-03: 2 g via INTRAVENOUS
  Filled 2024-10-03: qty 100

## 2024-10-03 MED ORDER — CHLORHEXIDINE GLUCONATE 0.12 % MT SOLN
15.0000 mL | Freq: Once | OROMUCOSAL | Status: AC
  Administered 2024-10-03: 15 mL via OROMUCOSAL

## 2024-10-03 MED ORDER — PROPOFOL 10 MG/ML IV BOLUS
INTRAVENOUS | Status: DC | PRN
  Administered 2024-10-03: 100 mg via INTRAVENOUS
  Administered 2024-10-03: 30 mg via INTRAVENOUS

## 2024-10-03 MED ORDER — ACETAMINOPHEN 10 MG/ML IV SOLN: 1000.0000 mg | Freq: Once | INTRAVENOUS | Status: DC | PRN

## 2024-10-03 MED ORDER — IOHEXOL 300 MG/ML  SOLN
INTRAMUSCULAR | Status: DC | PRN
  Administered 2024-10-03: 25 mL via URETHRAL

## 2024-10-03 MED ORDER — TRAMADOL HCL 50 MG PO TABS: 50.0000 mg | ORAL_TABLET | Freq: Four times a day (QID) | ORAL | 0 refills | Status: AC | PRN

## 2024-10-03 MED ORDER — OXYCODONE HCL 5 MG PO TABS: 5.0000 mg | ORAL_TABLET | Freq: Once | ORAL | Status: DC | PRN

## 2024-10-03 MED ORDER — PHENYLEPHRINE 80 MCG/ML (10ML) SYRINGE FOR IV PUSH (FOR BLOOD PRESSURE SUPPORT)
PREFILLED_SYRINGE | INTRAVENOUS | Status: AC
  Filled 2024-10-03: qty 20

## 2024-10-03 MED ORDER — SODIUM CHLORIDE 0.9 % IR SOLN
Status: DC | PRN
  Administered 2024-10-03: 3000 mL via INTRAVESICAL

## 2024-10-03 MED ORDER — OXYCODONE HCL 5 MG/5ML PO SOLN: 5.0000 mg | Freq: Once | ORAL | Status: DC | PRN

## 2024-10-03 MED ORDER — LACTATED RINGERS IV SOLN: INTRAVENOUS | Status: DC

## 2024-10-03 MED ORDER — FENTANYL CITRATE (PF) 100 MCG/2ML IJ SOLN
INTRAMUSCULAR | Status: DC | PRN
  Administered 2024-10-03: 25 ug via INTRAVENOUS
  Administered 2024-10-03: 50 ug via INTRAVENOUS
  Administered 2024-10-03: 25 ug via INTRAVENOUS

## 2024-10-03 MED ORDER — TAMSULOSIN HCL 0.4 MG PO CAPS: 0.4000 mg | ORAL_CAPSULE | Freq: Every day | ORAL | 0 refills | Status: AC

## 2024-10-03 MED ORDER — FENTANYL CITRATE (PF) 50 MCG/ML IJ SOSY: 25.0000 ug | PREFILLED_SYRINGE | INTRAMUSCULAR | Status: DC | PRN

## 2024-10-03 MED ORDER — NITROFURANTOIN MONOHYD MACRO 100 MG PO CAPS: 100.0000 mg | ORAL_CAPSULE | Freq: Two times a day (BID) | ORAL | 0 refills | Status: AC

## 2024-10-03 MED ORDER — MIDAZOLAM HCL 5 MG/5ML IJ SOLN
INTRAMUSCULAR | Status: DC | PRN
  Administered 2024-10-03: 2 mg via INTRAVENOUS

## 2024-10-03 MED ORDER — LIDOCAINE HCL (CARDIAC) PF 100 MG/5ML IV SOSY
PREFILLED_SYRINGE | INTRAVENOUS | Status: DC | PRN
  Administered 2024-10-03 (×2): 50 mg via INTRAVENOUS

## 2024-10-03 MED ORDER — SUGAMMADEX SODIUM 200 MG/2ML IV SOLN
INTRAVENOUS | Status: AC
  Filled 2024-10-03: qty 2

## 2024-10-03 MED ORDER — DEXAMETHASONE SODIUM PHOSPHATE 4 MG/ML IJ SOLN
INTRAMUSCULAR | Status: DC | PRN
  Administered 2024-10-03: 8 mg via INTRAVENOUS

## 2024-10-03 MED ORDER — FENTANYL CITRATE (PF) 100 MCG/2ML IJ SOLN
INTRAMUSCULAR | Status: AC
  Filled 2024-10-03: qty 2

## 2024-10-03 MED ORDER — KETOROLAC TROMETHAMINE 30 MG/ML IJ SOLN
INTRAMUSCULAR | Status: DC | PRN
  Administered 2024-10-03: 30 mg via INTRAVENOUS

## 2024-10-03 MED ORDER — ORAL CARE MOUTH RINSE: 15.0000 mL | Freq: Once | OROMUCOSAL | Status: AC

## 2024-10-03 MED ORDER — KETOROLAC TROMETHAMINE 30 MG/ML IJ SOLN
INTRAMUSCULAR | Status: AC
  Filled 2024-10-03: qty 1

## 2024-10-03 MED ORDER — CELECOXIB 100 MG PO CAPS: 100.0000 mg | ORAL_CAPSULE | Freq: Two times a day (BID) | ORAL | 1 refills | Status: AC | PRN

## 2024-10-03 MED ORDER — ONDANSETRON HCL 4 MG/2ML IJ SOLN: 4.0000 mg | Freq: Once | INTRAMUSCULAR | Status: DC | PRN

## 2024-10-03 MED ORDER — ONDANSETRON HCL 4 MG/2ML IJ SOLN
INTRAMUSCULAR | Status: DC | PRN
  Administered 2024-10-03: 4 mg via INTRAVENOUS

## 2024-10-03 SURGICAL SUPPLY — 24 items
BAG COUNTER SPONGE SURGICOUNT (BAG) IMPLANT
BAG URO CATCHER STRL LF (MISCELLANEOUS) ×1 IMPLANT
BASKET ZERO TIP NITINOL 2.4FR (BASKET) IMPLANT
CATH ROBINSON RED A/P 16FR (CATHETERS) IMPLANT
CATH URETERAL DUAL LUMEN 10F (MISCELLANEOUS) ×1 IMPLANT
CATH URETL OPEN END 6FR 70 (CATHETERS) IMPLANT
CLOTH BEACON ORANGE TIMEOUT ST (SAFETY) ×1 IMPLANT
EXTRACTOR STONE 1.7FRX115CM (UROLOGICAL SUPPLIES) IMPLANT
FIBER LASER FLEXIVA PULSE EA (MISCELLANEOUS) IMPLANT
GLOVE SS BIOGEL STRL SZ 7 (GLOVE) ×1 IMPLANT
GOWN STRL REUS W/ TWL XL LVL3 (GOWN DISPOSABLE) ×1 IMPLANT
GUIDEWIRE ANG ZIPWIRE 035X150 (WIRE) IMPLANT
GUIDEWIRE STR DUAL SENSOR (WIRE) IMPLANT
GUIDEWIRE ZIPWRE .038 STRAIGHT (WIRE) ×1 IMPLANT
IV NS 1000ML BAXH (IV SOLUTION) ×1 IMPLANT
KIT TURNOVER KIT A (KITS) ×1 IMPLANT
MANIFOLD NEPTUNE II (INSTRUMENTS) ×1 IMPLANT
PACK CYSTO (CUSTOM PROCEDURE TRAY) ×1 IMPLANT
SHEATH NAVIGATOR HD 11/13X28 (SHEATH) IMPLANT
SHEATH NAVIGATOR HD 11/13X36 (SHEATH) IMPLANT
STENT URET 6FRX24 CONTOUR (STENTS) IMPLANT
TRACTIP FLEXIVA PULS ID 200XHI (Laser) IMPLANT
TUBING CONNECTING 10 (TUBING) ×1 IMPLANT
TUBING UROLOGY SET (TUBING) ×1 IMPLANT

## 2024-10-03 NOTE — Anesthesia Procedure Notes (Signed)
 Procedure Name: LMA Insertion Date/Time: 10/03/2024 3:00 PM  Performed by: Vincenzo Show, CRNAPre-anesthesia Checklist: Patient identified, Emergency Drugs available, Suction available, Patient being monitored and Timeout performed Patient Re-evaluated:Patient Re-evaluated prior to induction Oxygen Delivery Method: Circle system utilized Preoxygenation: Pre-oxygenation with 100% oxygen Induction Type: IV induction Ventilation: Mask ventilation without difficulty LMA: LMA inserted LMA Size: 4.0 Tube size: 4.0 mm Number of attempts: 1 Placement Confirmation: positive ETCO2 and breath sounds checked- equal and bilateral Tube secured with: Tape Dental Injury: Teeth and Oropharynx as per pre-operative assessment

## 2024-10-03 NOTE — Op Note (Signed)
 Operative Note  Preoperative diagnosis:  1.  Right ureteral stone  Postoperative diagnosis: 1.  Right ureteral stone - impacted3  Procedure(s): 1.  Right ureteroscopy with laser lithotripsy and basket extraction of stones 2. Right ureteral stent placement 3. Right retrograde pyelogram 4. Fluoroscopy with intraoperative interpretation  Surgeon: Herlene Foot, MD  Assistants:  None  Anesthesia:  General  Complications:  None  EBL:  Minimal  Specimens: 1. stones for stone analysis   Drains/Catheters: 1.  Right 6Fr x 24cm ureteral stent (no tether)  Intraoperative findings:   Cystoscopy demonstrated unremarkable bladder Right Ureteroscopy demonstrated an impacted stone in the distal ureter. Stone fragmented and removed. Further residual stone fragments; checked multiple times to ensure I was not leaving behind any stones in the area of impaction Right Successful stent placement - 6x24 cm  Right Retrograde pyelogram - hydroureteronephrosis. Filling defect in distal ureter consistent with stone   Description of procedure: After informed consent was obtained from the patient, the patient was identified and taken to the operating room and placed in the supine position.  General anesthesia was administered as well as perioperative IV antibiotics.  At the beginning of the case, a time-out was performed to properly identify the patient, the surgery to be performed, and the surgical site.  Sequential compression devices were applied to the lower extremities at the beginning of the case for DVT prophylaxis.  The patient was then placed in the dorsal lithotomy supine position, prepped and draped in sterile fashion.  We then passed the 21-French rigid cystoscope through the urethra and into the bladder under vision without any difficulty, noting a normal urethra without strictures.  A systematic evaluation of the bladder revealed no evidence of any suspicious bladder lesions.  Ureteral  orifices were in normal position.    Under cystoscopic and flouroscopic guidance, we cannulated the right ureteral orifice with a 5-French open-ended ureteral catheter and a gentle retrograde pyelogram was performed, see findings above. A 0.035 glide wire was then passed up to the level of the renal pelvis and secured to the drape as a safety wire. The ureteral catheter and cystoscope were removed, leaving the safety wire in place.   A semi-rigid ureteroscope was passed alongside the wire up the distal ureter which appeared normal. The stone was identified in the distal ureter, impacted. I carefully used a laser to fragment the stone. The fragments were removed with the zero tip basket under direct visualization. I was then able to insert the semi rigid up to the collecting system and no further stones were identified. As mentioned above, I carefully inspected the area of impaction multiple times to make sure I did not leave behind residual stones. Injected more contrast and there was no evidence of extravasation  The semi-rigid ureteroscope was removed. We then used the Glidewire under fluoroscopic guidance and passed up a 6-French, 24 cm double-pigtail ureteral stent up ureter, making sure that the proximal and distal ends coiled within the kidney and bladder respectively.  Note that we left a tether attached to the distal end of the ureteral stent and it exited the urethral meatus.  The cystoscope was then advanced back into the bladder under vision.  We were able to see the distal stent coiling nicely within the bladder.  The bladder was then emptied with irrigation solution.  The cystoscope was then removed.    The patient tolerated the procedure well and there was no complication. Patient was awoken from anesthesia and taken to the recovery  room in stable condition.   Plan:  Patient will be discharged home. No string left on stent - will need to f/u in clinic in ~3 weeks for removal   G. Herlene Foot MD [facility]  Pager: [phone]

## 2024-10-03 NOTE — Discharge Instructions (Signed)

## 2024-10-03 NOTE — Anesthesia Preprocedure Evaluation (Addendum)
 Anesthesia Evaluation  Patient identified by MRN, date of birth, ID band Patient awake    Reviewed: Allergy & Precautions, NPO status , Patient's Chart, lab work & pertinent test results  History of Anesthesia Complications (+) PONV and history of anesthetic complications  Airway Mallampati: II  TM Distance: <3 FB Neck ROM: Full    Dental  (+) Partial Lower, Partial Upper, Dental Advisory Given   Pulmonary neg pulmonary ROS   breath sounds clear to auscultation       Cardiovascular negative cardio ROS  Rhythm:Regular Rate:Normal     Neuro/Psych   Anxiety     negative neurological ROS     GI/Hepatic negative GI ROS, Neg liver ROS,,,  Endo/Other    Renal/GU Hx of Kidney Stones      Musculoskeletal   Abdominal   Peds  Hematology   Anesthesia Other Findings   Reproductive/Obstetrics                              Anesthesia Physical Anesthesia Plan  ASA: 2  Anesthesia Plan: General   Post-op Pain Management:    Induction: Intravenous  PONV Risk Score and Plan: 2 and Ondansetron , Dexamethasone  and Treatment may vary due to age or medical condition  Airway Management Planned: LMA  Additional Equipment: None  Intra-op Plan:   Post-operative Plan: Extubation in OR  Informed Consent:      Dental advisory given  Plan Discussed with: CRNA  Anesthesia Plan Comments:          Anesthesia Quick Evaluation

## 2024-10-03 NOTE — Transfer of Care (Signed)
 Immediate Anesthesia Transfer of Care Note  Patient: Sonia Jackson  Procedure(s) Performed: Procedure(s): CYSTOSCOPY/URETEROSCOPY/HOLMIUM LASER/STENT PLACEMENT (Right)  Patient Location: PACU  Anesthesia Type:General  Level of Consciousness:  sedated, patient cooperative and responds to stimulation  Airway & Oxygen Therapy:Patient Spontanous Breathing and Patient connected to face mask oxgen  Post-op Assessment:  Report given to PACU RN and Post -op Vital signs reviewed and stable  Post vital signs:  Reviewed and stable  Last Vitals:  Vitals:   10/03/24 1211 10/03/24 1601  BP: (!) 159/73   Pulse: 82   Resp: 16 19  Temp: 36.7 C   SpO2: 100%     Complications: No apparent anesthesia complications

## 2024-10-03 NOTE — H&P (Signed)
 H&P  History of Present Illness: Sonia Jackson is a 68 y.o. year old F who presents today for treatment of a right ureteral stone  No acute complaints  Past Medical History:  Diagnosis Date   Anxiety    Hard of hearing    History of kidney stones    Osteoporosis    PONV (postoperative nausea and vomiting)    nausea only    Past Surgical History:  Procedure Laterality Date   ABDOMINAL HYSTERECTOMY     APPENDECTOMY     arm surgery Left    BREAST EXCISIONAL BIOPSY Right 2017   benign   BREAST LUMPECTOMY WITH RADIOACTIVE SEED LOCALIZATION Right 08/27/2016   Procedure: RIGHT BREAST LUMPECTOMY WITH RADIOACTIVE SEED LOCALIZATION;  Surgeon: Deward Null III, MD;  Location: [facility] OR;  Service: General;  Laterality: Right;   CARPAL TUNNEL RELEASE Left    CHOLECYSTECTOMY     COLONOSCOPY     COLONOSCOPY W/ POLYPECTOMY  05/20/2023   MS at [facility]   CYSTOSCOPY WITH RETROGRADE PYELOGRAM, URETEROSCOPY AND STENT PLACEMENT Right 11/19/2021   Procedure: CYSTOSCOPY WITH RIGHT RETROGRADE PYELOGRAM, URETEROSCOPY WITH  HOLMIUM LASER AND STENT PLACEMENT;  Surgeon: Lovie Arlyss CROME, MD;  Location: [facility] ORS;  Service: Urology;  Laterality: Right;   ESOPHAGOGASTRODUODENOSCOPY     FOOT SURGERY Bilateral    KNEE SURGERY Left    TONSILLECTOMY      Home Medications:  Current Meds  Medication Sig   alendronate (FOSAMAX) 70 MG tablet Take 70 mg by mouth once a week.   ALPRAZolam (XANAX) 0.5 MG tablet Take 0.5 mg by mouth at bedtime as needed for sleep.   Cholecalciferol (VITAMIN D3 PO) Take 1 tablet by mouth daily.   methocarbamol (ROBAXIN) 500 MG tablet Take 500 mg by mouth every 8 (eight) hours as needed for muscle spasms.   ondansetron  (ZOFRAN -ODT) 4 MG disintegrating tablet Take 1 tablet (4 mg total) by mouth every 8 (eight) hours as needed for nausea or vomiting.   oxyCODONE -acetaminophen  (PERCOCET/ROXICET) 5-325 MG tablet Take 1 tablet by mouth every 6 (six) hours as needed for severe pain (pain score  7-10).   phentermine (ADIPEX-P) 37.5 MG tablet Take 37.5 mg by mouth every morning.    Allergies:  Allergies  Allergen Reactions   Compazine Other (See Comments)    Severe numbness to face and legs   Compazine  [Prochlorperazine]    Sulfa Antibiotics     Family History  Problem Relation Age of Onset   Healthy Mother    Diabetes Father    Breast cancer Neg Hx    Colon cancer Neg Hx    Esophageal cancer Neg Hx    Stomach cancer Neg Hx    Rectal cancer Neg Hx    Colon polyps Neg Hx     Social History:  reports that she has never smoked. She has never used smokeless tobacco. She reports that she does not drink alcohol and does not use drugs.  ROS: A complete review of systems was performed.  All systems are negative except for pertinent findings as noted.  Physical Exam:  Vital signs in last 24 hours: Temp:  [98 F (36.7 C)] 98 F (36.7 C) (12/09 1211) Pulse Rate:  [82] 82 (12/09 1211) Resp:  [16] 16 (12/09 1211) BP: (159)/(73) 159/73 (12/09 1211) SpO2:  [100 %] 100 % (12/09 1211) Weight:  [62.1 kg] 62.1 kg (12/09 1222) Constitutional:  Alert and oriented, No acute distress Cardiovascular: Regular rate and rhythm Respiratory: Normal  respiratory effort, Lungs clear bilaterally GI: Abdomen is soft, nontender, nondistended, no abdominal masses Lymphatic: No lymphadenopathy Neurologic: Grossly intact, no focal deficits Psychiatric: Normal mood and affect   Laboratory Data:  No results for input(s): WBC, HGB, HCT, PLT in the last 72 hours.  No results for input(s): NA, K, CL, GLUCOSE, BUN, CALCIUM, CREATININE in the last 72 hours.  Invalid input(s): CO3   No results found for this or any previous visit (from the past 24 hours). No results found for this or any previous visit (from the past 240 hours).  Renal Function: No results for input(s): CREATININE in the last 168 hours. CrCl cannot be calculated (Patient's most recent lab result is  older than the maximum 21 days allowed.).  Radiologic Imaging: No results found.  Assessment:  Sonia Jackson is a 68 y.o. year old F with right ureteral stone   Plan:  --to OR as planned for right ureteroscopy with laser litho, stent. Procedure and risks reviewed, including but not limited to hematuria, infection, sepsis, damage to GU tract, failure to complete procedure, retained stone fragments, need for future procedures, stent pain, prolonged stent.   Herlene Foot, MD 10/03/2024, 2:02 PM  [facility] Pager: [phone]

## 2024-10-04 ENCOUNTER — Encounter (HOSPITAL_COMMUNITY): Payer: Self-pay | Admitting: Urology

## 2024-10-05 NOTE — Anesthesia Postprocedure Evaluation (Signed)
 Anesthesia Post Note  Patient: Sonia Jackson  Procedure(s) Performed: CYSTOSCOPY/URETEROSCOPY/HOLMIUM LASER/STENT PLACEMENT (Right: Pelvis)     Anesthesia Type: General Anesthetic complications: no   No notable events documented.  Last Vitals:  Vitals:   10/03/24 1615 10/03/24 1630  BP: (!) 163/80 (!) 176/71  Pulse: 72 69  Resp: 18 12  Temp:  36.7 C  SpO2: 100% 99%    Last Pain:  Vitals:   10/03/24 1630  TempSrc:   PainSc: 0-No pain                 [name]

## 2024-10-11 NOTE — Progress Notes (Unsigned)
 10/12/2024 Joscelyn Macario Collet [id number] 1956/02/22  Referring provider: Valentin Skates, DO Primary GI doctor: Dr. Federico ( Dr. Aneita)  ASSESSMENT AND PLAN:  Dysphagia x several months, with solids, denies GERD possible LPR, no regurgitation or weight loss status post cholecystectomy - start on pantoprazole  20 mg daily - will need to hold phentermine 10 days before procedure, last taken 12/16.  - Hold fosamax for now, consider alternative like prolia -Schedule EGD with dilatation to evaluate for stenosis, tumor, erosive/infectious esophagititis, and EOE. I discussed risks of EGD with patient today, including risk of sedation, bleeding or perforation.  Patient provides understanding and gave verbal consent to proceed. - consider barium swallow if negative -In the interim patient advised about swallowing precautions.  -Eat slowly, chew food well before swallowing.  -Drink liquids in between each bite to avoid food impaction. - ER precautions discussed with the patient  Personal history of tubular adenomatous polyps 05/20/2023 colonoscopy with Dr. Aneita for personal history of adenomatous polyps excellent prep 5 mm polyp ileocecal valve sessile 10 mm polyp distal rectum sessile complete retrieval otherwise unremarkable 1 TA polyp 1 tubulovillous adenomatous polyp with serrated features no dysplasia recall 3 years   Patient Care Team: Valentin Skates, DO as PCP - General (Internal Medicine) Curvin Deward MOULD, MD as Consulting Physician (General Surgery)  HISTORY OF PRESENT ILLNESS: 68 y.o. female with a past medical history listed below presents for evaluation of dysphagia.   Discussed the use of AI scribe software for clinical note transcription with the patient, who gave verbal consent to proceed.  History of Present Illness   Baby Corona Popovich is a 68 year old female with nephrolithiasis and obesity who presents with several months of progressive dysphagia to solids.  For  several months, she has experienced gradually worsening difficulty swallowing solid foods. Food often feels stuck in her upper esophagus, and she requires water to help food pass. The sensation is not associated with shortness of breath, but rather a feeling of obstruction until she drinks water. Even small grains can become stuck if she has not eaten for a while.  Dysphagia is less frequent and less severe with pills. She has no difficulty swallowing liquids, including water and saliva. There is no regurgitation, weight loss, odynophagia, chest pain, or heartburn. She reports intermittent coughing and frequent throat clearing due to a persistent throat sensation, and describes some hoarseness. No history of neck surgery or radiation. She underwent partial breast tissue removal a few years ago, without breast radiation.  She recently passed a kidney stone and currently has a bladder stent in place. Current medications include Celebrex , Fosamax (once weekly), and recent use of phentermine. She does not use Aleve , ibuprofen , or Goody powders. She does not smoke or use tobacco products.        She  reports that she has never smoked. She has never used smokeless tobacco. She reports that she does not drink alcohol and does not use drugs.  RELEVANT GI HISTORY, IMAGING AND LABS: Results          CBC    Component Value Date/Time   WBC 7.1 09/26/2024 0826   RBC 4.48 09/26/2024 0826   HGB 13.6 09/26/2024 0826   HCT 41.5 09/26/2024 0826   PLT 215 09/26/2024 0826   MCV 92.6 09/26/2024 0826   MCH 30.4 09/26/2024 0826   MCHC 32.8 09/26/2024 0826   RDW 12.8 09/26/2024 0826   LYMPHSABS 1.6 10/25/2021 0859   MONOABS 0.6 10/25/2021 0859  EOSABS 0.2 10/25/2021 0859   BASOSABS 0.0 10/25/2021 0859   Recent Labs    08/27/24 1049 09/26/24 0826  HGB 14.6 13.6    CMP     Component Value Date/Time   NA 139 08/27/2024 1049   K 4.1 08/27/2024 1049   CL 103 08/27/2024 1049   CO2 25 08/27/2024  1049   GLUCOSE 103 (H) 08/27/2024 1049   BUN 14 08/27/2024 1049   CREATININE 0.88 08/27/2024 1049   CALCIUM 9.2 08/27/2024 1049   PROT 7.0 08/27/2024 1049   ALBUMIN 3.6 08/27/2024 1049   AST 21 08/27/2024 1049   ALT 15 08/27/2024 1049   ALKPHOS 69 08/27/2024 1049   BILITOT 1.0 08/27/2024 1049   GFRNONAA >60 08/27/2024 1049   GFRAA >60 08/24/2016 0836      Latest Ref Rng & Units 08/27/2024   10:49 AM 10/25/2021    8:59 AM 01/07/2015    1:29 PM  Hepatic Function  Total Protein 6.5 - 8.1 g/dL 7.0  6.8  6.7   Albumin 3.5 - 5.0 g/dL 3.6  3.6  3.3   AST 15 - 41 U/L 21  24  23    ALT 0 - 44 U/L 15  20  24    Alk Phosphatase 38 - 126 U/L 69  78  92   Total Bilirubin 0.0 - 1.2 mg/dL 1.0  0.3  0.4       Current Medications:   Current Outpatient Medications (Endocrine & Metabolic):    alendronate (FOSAMAX) 70 MG tablet, Take 70 mg by mouth once a week.  Current Outpatient Medications (Analgesics):    celecoxib  (CELEBREX ) 100 MG capsule, Take 1 capsule (100 mg total) by mouth 2 (two) times daily as needed for moderate pain (pain score 4-6). (Patient not taking: Reported on 10/12/2024)   oxyCODONE -acetaminophen  (PERCOCET/ROXICET) 5-325 MG tablet, Take 1 tablet by mouth every 6 (six) hours as needed for severe pain (pain score 7-10). (Patient not taking: Reported on 10/12/2024)  Current Outpatient Medications (Other):    ALPRAZolam (XANAX) 0.5 MG tablet, Take 0.5 mg by mouth at bedtime as needed for sleep.   Cholecalciferol (VITAMIN D3 PO), Take 1 tablet by mouth daily.   methocarbamol (ROBAXIN) 500 MG tablet, Take 500 mg by mouth every 8 (eight) hours as needed for muscle spasms.   pantoprazole  (PROTONIX ) 20 MG tablet, Take 1 tablet (20 mg total) by mouth daily.   phentermine (ADIPEX-P) 37.5 MG tablet, Take 37.5 mg by mouth every morning.   ondansetron  (ZOFRAN -ODT) 4 MG disintegrating tablet, Take 1 tablet (4 mg total) by mouth every 8 (eight) hours as needed for nausea or vomiting.  (Patient not taking: Reported on 10/12/2024)   tamsulosin  (FLOMAX ) 0.4 MG CAPS capsule, Take 1 capsule (0.4 mg total) by mouth daily. (Patient not taking: Reported on 10/12/2024)  Medical History:  Past Medical History:  Diagnosis Date   Anxiety    Hard of hearing    History of kidney stones    Osteoporosis    PONV (postoperative nausea and vomiting)    nausea only   Allergies: Allergies[1]   Surgical History:  She  has a past surgical history that includes Knee surgery (Left); Foot surgery (Bilateral); arm surgery (Left); Abdominal hysterectomy; Cholecystectomy; Appendectomy; Carpal tunnel release (Left); Colonoscopy; Esophagogastroduodenoscopy; Breast lumpectomy with radioactive seed localization (Right, 08/27/2016); Breast excisional biopsy (Right, 2017); Cystoscopy with retrograde pyelogram, ureteroscopy and stent placement (Right, 11/19/2021); Colonoscopy w/ polypectomy (05/20/2023); Tonsillectomy; and Cystoscopy/ureteroscopy/holmium laser/stent placement (Right, 10/03/2024). Family History:  Her  family history includes Diabetes in her father; Healthy in her mother.  REVIEW OF SYSTEMS  : All other systems reviewed and negative except where noted in the History of Present Illness.  PHYSICAL EXAM: BP 130/70   Pulse 75   Ht 5' (1.524 m)   Wt 141 lb (64 kg)   BMI 27.54 kg/m  Physical Exam   GENERAL APPEARANCE: Well nourished, in no apparent distress HEENT: No cervical lymphadenopathy, unremarkable thyroid, sclerae anicteric, conjunctiva pink RESPIRATORY: Respiratory effort normal, BS equal bilateral without rales, rhonchi, wheezing CARDIO: RRR with no MRGs, peripheral pulses intact ABDOMEN: Soft, non distended, active bowel sounds in all 4 quadrants, no tenderness to palpation, no rebound, no mass appreciated RECTAL: declines MUSCULOSKELETAL: Full ROM, normal gait, without edema SKIN: Dry, intact without rashes or lesions. No jaundice. NEURO: Alert, oriented, no focal  deficits PSYCH: Cooperative, normal mood and affect.      Alan JONELLE Coombs, PA-C 11:39 AM      [1]  Allergies Allergen Reactions   Compazine Other (See Comments)    Severe numbness to face and legs   Compazine  [Prochlorperazine]    Sulfa Antibiotics

## 2024-10-12 ENCOUNTER — Ambulatory Visit: Admitting: Physician Assistant

## 2024-10-12 ENCOUNTER — Encounter: Payer: Self-pay | Admitting: Physician Assistant

## 2024-10-12 VITALS — BP 130/70 | HR 75 | Ht 60.0 in | Wt 141.0 lb

## 2024-10-12 DIAGNOSIS — Z860101 Personal history of adenomatous and serrated colon polyps: Secondary | ICD-10-CM | POA: Diagnosis not present

## 2024-10-12 DIAGNOSIS — R1310 Dysphagia, unspecified: Secondary | ICD-10-CM

## 2024-10-12 MED ORDER — PANTOPRAZOLE SODIUM 20 MG PO TBEC: 20.0000 mg | DELAYED_RELEASE_TABLET | Freq: Every day | ORAL | 0 refills | Status: AC

## 2024-10-12 NOTE — Patient Instructions (Addendum)
 _______________________________________________________  If your blood pressure at your visit was 140/90 or greater, please contact your primary care physician to follow up on this.  _______________________________________________________  If you are age 67 or older, your body mass index should be between 23-30. Your Body mass index is 27.54 kg/m. If this is out of the aforementioned range listed, please consider follow up with your Primary Care Provider.  If you are age 53 or younger, your body mass index should be between 19-25. Your Body mass index is 27.54 kg/m. If this is out of the aformentioned range listed, please consider follow up with your Primary Care Provider.   ________________________________________________________  The [facility] providers would like to encourage you to use MYCHART to communicate with providers for non-urgent requests or questions.  Due to long hold times on the telephone, sending your provider a message by [facility] may be a faster and more efficient way to get a response.  Please allow 48 business hours for a response.  Please remember that this is for non-urgent requests.  _______________________________________________________  [facility] is using a team-based approach to care.  Your team is made up of your doctor and two to three APPS. Our APPS (Nurse Practitioners and Physician Assistants) work with your physician to ensure care continuity for you. They are fully qualified to address your health concerns and develop a treatment plan. They communicate directly with your gastroenterologist to care for you. Seeing the Advanced Practice Practitioners on your physician's team can help you by facilitating care more promptly, often allowing for earlier appointments, access to diagnostic testing, procedures, and other specialty referrals.   You have been scheduled for an endoscopy. Please follow written instructions given to you at your visit  today.  If you use inhalers (even only as needed), please bring them with you on the day of your procedure.  If you take any of the following medications, they will need to be adjusted prior to your procedure:   DO NOT TAKE 7 DAYS PRIOR TO TEST- Trulicity (dulaglutide) Ozempic, Wegovy (semaglutide) Mounjaro, Zepbound (tirzepatide) Bydureon Bcise (exanatide extended release)  DO NOT TAKE 1 DAY PRIOR TO YOUR TEST Rybelsus (semaglutide) Adlyxin (lixisenatide) Victoza (liraglutide) Byetta (exanatide) ___________________________________________________________________________  Dysphagia precautions:  1. Take reflux medications 30+ minutes before food in the morning, pantoprazole  20 mg daily 2. Begin meals with warm beverage 3. Eat smaller more frequent meals 4. Eat slowly, taking small bites and sips 5. Alternate solids and liquids 6. Avoid foods/liquids that increase acid production 7. Sit upright during and for 30+ minutes after meals to facilitate esophageal clearing 8. All meats should be chopped finely.   If something gets hung in your esophagus and will not come up or go down, proceed to the emergency room.    Stop the fosamax and the phentermine  Silent reflux: Not all heartburn burns...SABRASABRASABRA  What is LPR? Laryngopharyngeal reflux (LPR) or silent reflux is a condition in which acid that is made in the stomach travels up the esophagus (swallowing tube) and gets to the throat. Not everyone with reflux has a lot of heartburn or indigestion. In fact, many people with LPR never have heartburn. This is why LPR is called SILENT REFLUX, and the terms Silent reflux and LPR are often used interchangeably. Because LPR is silent, it is sometimes difficult to diagnose.  How can you tell if you have LPR?  Chronic hoarseness- Some people have hoarseness that comes and goes throat clearing  Cough It can cause shortness of  breath and cause asthma like symptoms. a feeling of a lump in  the throat  difficulty swallowing a problem with too much nose and throat drainage.  Some people will feel their esophagus spasm which feels like their heart beating hard and fast, this will usually be after a meal, at rest, or lying down at night.    How do I treat this? Treatment for LPR should be individualized, and your doctor will suggest the best treatment for you. Generally there are several treatments for LPR: changing habits and diet to reduce reflux,  medications to reduce stomach acid, and  surgery to prevent reflux. Most people with LPR need to modify how and when they eat, as well as take some medication, to get well. Sometimes, nonprescription liquid antacids, such as Maalox, Gelucil and Mylanta are recommended. When used, these antacids should be taken four times each day - one tablespoon one hour after each meal and before bedtime. Dietary and lifestyle changes alone are not often enough to control LPR - medications that reduce stomach acid are also usually needed. These must be prescribed by our doctor.   TIPS FOR REDUCING REFLUX AND LPR Control your LIFE-STYLE and your DIET! If you use tobacco, QUIT.  Smoking makes you reflux. After every cigarette you have some LPR.  Don't wear clothing that is too tight, especially around the waist (trousers, corsets, belts).  Do not lie down just after eating...in fact, do not eat within three hours of bedtime.  You should be on a low-fat diet.  Limit your intake of red meat.  Limit your intake of butter.  Avoid fried foods.  Avoid chocolate  Avoid cheese.  Avoid eggs. Specifically avoid caffeine (especially coffee and tea), soda pop (especially cola) and mints.  Avoid alcoholic beverages, particularly in the evening.

## 2024-10-31 ENCOUNTER — Telehealth: Payer: Self-pay | Admitting: Pediatrics

## 2024-10-31 ENCOUNTER — Ambulatory Visit: Admitting: Internal Medicine

## 2024-10-31 ENCOUNTER — Encounter: Payer: Self-pay | Admitting: Internal Medicine

## 2024-10-31 VITALS — BP 163/88 | HR 76 | Temp 97.3°F | Resp 18 | Ht 60.0 in | Wt 141.0 lb

## 2024-10-31 DIAGNOSIS — K222 Esophageal obstruction: Secondary | ICD-10-CM

## 2024-10-31 DIAGNOSIS — K2289 Other specified disease of esophagus: Secondary | ICD-10-CM

## 2024-10-31 DIAGNOSIS — R1310 Dysphagia, unspecified: Secondary | ICD-10-CM

## 2024-10-31 DIAGNOSIS — K2970 Gastritis, unspecified, without bleeding: Secondary | ICD-10-CM

## 2024-10-31 MED ORDER — SODIUM CHLORIDE 0.9 % IV SOLN: 500.0000 mL | Freq: Once | INTRAVENOUS | Status: DC

## 2024-10-31 MED ORDER — PANTOPRAZOLE SODIUM 40 MG PO TBEC: 40.0000 mg | DELAYED_RELEASE_TABLET | Freq: Every day | ORAL | 1 refills | Status: AC

## 2024-10-31 NOTE — Patient Instructions (Addendum)
" °-   follow post dilation diet - Discharge patient to home (with escort). - Await pathology results.  - Increase pantoprazole  dosage to 40 mg daily for 8    weeks, then decrease to 20 mg daily.  - Return to GI clinic in 2-3 months.  - The findings and recommendations were discussed      with the patient.  YOU HAD AN ENDOSCOPIC PROCEDURE TODAY AT THE [facility]:   Refer to the procedure report that was given to you for any specific questions about what was found during the examination.  If the procedure report does not answer your questions, please call your gastroenterologist to clarify.  If you requested that your care partner not be given the details of your procedure findings, then the procedure report has been included in a sealed envelope for you to review at your convenience later.  YOU SHOULD EXPECT: Some feelings of bloating in the abdomen. Passage of more gas than usual.  Walking can help get rid of the air that was put into your GI tract during the procedure and reduce the bloating. If you had a lower endoscopy (such as a colonoscopy or flexible sigmoidoscopy) you may notice spotting of blood in your stool or on the toilet paper. If you underwent a bowel prep for your procedure, you may not have a normal bowel movement for a few days.  Please Note:  You might notice some irritation and congestion in your nose or some drainage.  This is from the oxygen used during your procedure.  There is no need for concern and it should clear up in a day or so.  SYMPTOMS TO REPORT IMMEDIATELY:  Following upper endoscopy (EGD)  Vomiting of blood or coffee ground material  New chest pain or pain under the shoulder blades  Painful or persistently difficult swallowing  New shortness of breath  Fever of 100F or higher  Black, tarry-looking stools  For urgent or emergent issues, a gastroenterologist can be reached at any hour by calling [phone]. Do not use MyChart messaging for  urgent concerns.    DIET:  We do recommend a small meal at first, but then you may proceed to your regular diet.  Drink plenty of fluids but you should avoid alcoholic beverages for 24 hours.  ACTIVITY:  You should plan to take it easy for the rest of today and you should NOT DRIVE or use heavy machinery until tomorrow (because of the sedation medicines used during the test).    FOLLOW UP: Our staff will call the number listed on your records the next business day following your procedure.  We will call around 7:15- 8:00 am to check on you and address any questions or concerns that you may have regarding the information given to you following your procedure. If we do not reach you, we will leave a message.     If any biopsies were taken you will be contacted by phone or by letter within the next 1-3 weeks.  Please call us  at [phone] if you have not heard about the biopsies in 3 weeks.    SIGNATURES/CONFIDENTIALITY: You and/or your care partner have signed paperwork which will be entered into your electronic medical record.  These signatures attest to the fact that that the information above on your After Visit Summary has been reviewed and is understood.  Full responsibility of the confidentiality of this discharge information lies with you and/or your care-partner. "

## 2024-10-31 NOTE — Progress Notes (Signed)
 "   GASTROENTEROLOGY PROCEDURE H&P NOTE   Primary Care Physician: Valentin Skates, DO    Reason for Procedure:   Dysphagia  Plan:    EGD  Patient is appropriate for endoscopic procedure(s) in the ambulatory ([facility]) setting.  The nature of the procedure, as well as the risks, benefits, and alternatives were carefully and thoroughly reviewed with the patient. Ample time for discussion and questions allowed. The patient understood, was satisfied, and agreed to proceed.     HPI: Sonia Jackson is a 69 y.o. female who presents for EGD for evaluation of dysphagia .  Patient was most recently seen in the Gastroenterology Clinic on 10/12/24.  No interval change in medical history since that appointment. Please refer to that note for full details regarding GI history and clinical presentation.   Past Medical History:  Diagnosis Date   Anxiety    Hard of hearing    History of Jackson stones    Osteoporosis    PONV (postoperative nausea and vomiting)    nausea only    Past Surgical History:  Procedure Laterality Date   ABDOMINAL HYSTERECTOMY     APPENDECTOMY     arm surgery Left    BREAST EXCISIONAL BIOPSY Right 2017   benign   BREAST LUMPECTOMY WITH RADIOACTIVE SEED LOCALIZATION Right 08/27/2016   Procedure: RIGHT BREAST LUMPECTOMY WITH RADIOACTIVE SEED LOCALIZATION;  Surgeon: Deward Null III, MD;  Location: [facility];  Service: General;  Laterality: Right;   CARPAL TUNNEL RELEASE Left    CHOLECYSTECTOMY     COLONOSCOPY     COLONOSCOPY W/ POLYPECTOMY  05/20/2023   MS at [facility]   CYSTOSCOPY WITH RETROGRADE PYELOGRAM, URETEROSCOPY AND STENT PLACEMENT Right 11/19/2021   Procedure: CYSTOSCOPY WITH RIGHT RETROGRADE PYELOGRAM, URETEROSCOPY WITH  HOLMIUM LASER AND STENT PLACEMENT;  Surgeon: Lovie Arlyss CROME, MD;  Location: [facility];  Service: Urology;  Laterality: Right;   CYSTOSCOPY/URETEROSCOPY/HOLMIUM LASER/STENT PLACEMENT Right 10/03/2024   Procedure: CYSTOSCOPY/URETEROSCOPY/HOLMIUM  LASER/STENT PLACEMENT;  Surgeon: Lovie Arlyss CROME, MD;  Location: [facility];  Service: Urology;  Laterality: Right;   ESOPHAGOGASTRODUODENOSCOPY     FOOT SURGERY Bilateral    KNEE SURGERY Left    TONSILLECTOMY      Prior to Admission medications  Medication Sig Start Date End Date Taking? Authorizing Provider  alendronate (FOSAMAX) 70 MG tablet Take 70 mg by mouth once a week. 10/10/21   [provider]  ALPRAZolam (XANAX) 0.5 MG tablet Take 0.5 mg by mouth at bedtime as needed for sleep.    [provider]  celecoxib  (CELEBREX ) 100 MG capsule Take 1 capsule (100 mg total) by mouth 2 (two) times daily as needed for moderate pain (pain score 4-6). Patient not taking: Reported on 10/12/2024 10/03/24   Lovie Arlyss CROME, MD  Cholecalciferol (VITAMIN D3 PO) Take 1 tablet by mouth daily.    [provider]  methocarbamol (ROBAXIN) 500 MG tablet Take 500 mg by mouth every 8 (eight) hours as needed for muscle spasms. 08/19/24   [provider]  ondansetron  (ZOFRAN -ODT) 4 MG disintegrating tablet Take 1 tablet (4 mg total) by mouth every 8 (eight) hours as needed for nausea or vomiting. Patient not taking: Reported on 10/12/2024 08/27/24   Towana Ozell BROCKS, MD  oxyCODONE -acetaminophen  (PERCOCET/ROXICET) 5-325 MG tablet Take 1 tablet by mouth every 6 (six) hours as needed for severe pain (pain score 7-10). Patient not taking: Reported on 10/12/2024 08/27/24   Towana Ozell BROCKS, MD  pantoprazole  (PROTONIX ) 20 MG tablet Take  1 tablet (20 mg total) by mouth daily. 10/12/24   Craig Alan SAUNDERS, PA-C  phentermine (ADIPEX-P) 37.5 MG tablet Take 37.5 mg by mouth every morning. 08/30/24   [provider]  tamsulosin  (FLOMAX ) 0.4 MG CAPS capsule Take 1 capsule (0.4 mg total) by mouth daily. Patient not taking: Reported on 10/12/2024 10/03/24   Lovie Arlyss CROME, MD    Current Outpatient Medications  Medication Sig Dispense Refill   alendronate (FOSAMAX) 70 MG tablet Take  70 mg by mouth once a week.     ALPRAZolam (XANAX) 0.5 MG tablet Take 0.5 mg by mouth at bedtime as needed for sleep.     celecoxib  (CELEBREX ) 100 MG capsule Take 1 capsule (100 mg total) by mouth 2 (two) times daily as needed for moderate pain (pain score 4-6). (Patient not taking: Reported on 10/12/2024) 30 capsule 1   Cholecalciferol (VITAMIN D3 PO) Take 1 tablet by mouth daily.     methocarbamol (ROBAXIN) 500 MG tablet Take 500 mg by mouth every 8 (eight) hours as needed for muscle spasms.     ondansetron  (ZOFRAN -ODT) 4 MG disintegrating tablet Take 1 tablet (4 mg total) by mouth every 8 (eight) hours as needed for nausea or vomiting. (Patient not taking: Reported on 10/12/2024) 20 tablet 0   oxyCODONE -acetaminophen  (PERCOCET/ROXICET) 5-325 MG tablet Take 1 tablet by mouth every 6 (six) hours as needed for severe pain (pain score 7-10). (Patient not taking: Reported on 10/12/2024) 15 tablet 0   pantoprazole  (PROTONIX ) 20 MG tablet Take 1 tablet (20 mg total) by mouth daily. 30 tablet 0   phentermine (ADIPEX-P) 37.5 MG tablet Take 37.5 mg by mouth every morning.     tamsulosin  (FLOMAX ) 0.4 MG CAPS capsule Take 1 capsule (0.4 mg total) by mouth daily. (Patient not taking: Reported on 10/12/2024) 30 capsule 0   Current Facility-Administered Medications  Medication Dose Route Frequency Provider Last Rate Last Admin   0.9 %  sodium chloride  infusion  500 mL Intravenous Once Federico Rosario BROCKS, MD        Allergies as of 10/31/2024 - Review Complete 10/31/2024  Allergen Reaction Noted   Compazine Other (See Comments) 06/05/2011   Compazine  [prochlorperazine] Other (See Comments) 12/03/2019   Sulfa antibiotics Other (See Comments) 05/06/2023    Family History  Problem Relation Age of Onset   Healthy Mother    Diabetes Father    Breast cancer Neg Hx    Colon cancer Neg Hx    Esophageal cancer Neg Hx    Stomach cancer Neg Hx    Rectal cancer Neg Hx    Colon polyps Neg Hx     Social History    Socioeconomic History   Marital status: Married    Spouse name: Not on file   Number of children: Not on file   Years of education: Not on file   Highest education level: Not on file  Occupational History   Not on file  Tobacco Use   Smoking status: Never   Smokeless tobacco: Never  Vaping Use   Vaping status: Never Used  Substance and Sexual Activity   Alcohol use: No   Drug use: No   Sexual activity: Yes    Birth control/protection: Surgical  Other Topics Concern   Not on file  Social History Narrative   Not on file   Social Drivers of Health   Tobacco Use: Low Risk (10/03/2024)   Patient History    Smoking Tobacco Use: Never  Smokeless Tobacco Use: Never    Passive Exposure: Not on file  Financial Resource Strain: Not on file  Food Insecurity: Not on file  Transportation Needs: Not on file  Physical Activity: Not on file  Stress: Not on file  Social Connections: Not on file  Intimate Partner Violence: Not on file  Depression (PHQ2-9): Not on file  Alcohol Screen: Not on file  Housing: Not on file  Utilities: Not on file  Health Literacy: Not on file    Physical Exam: Vital signs in last 24 hours: BP (!) 161/76   Pulse (!) 55   Temp (!) 97.3 F (36.3 C)   Ht 5' (1.524 m)   Wt 141 lb (64 kg)   SpO2 100%   BMI 27.54 kg/m  GEN: NAD EYE: Sclerae anicteric ENT: MMM CV: Non-tachycardic Pulm: No increased WOB GI: Soft NEURO:  Alert & Oriented   Sonia Kidney, MD [facility]   10/31/2024 2:29 PM  "

## 2024-10-31 NOTE — Progress Notes (Signed)
 Called to room to assist during endoscopic procedure.  Patient ID and intended procedure confirmed with present staff. Received instructions for my participation in the procedure from the performing physician.

## 2024-10-31 NOTE — Telephone Encounter (Signed)
 I spoke on the phone with Sonia Jackson regarding symptoms of sore throat and difficulty swallowing phlegm after EGD with dilation today.  Denies having any antecedent phlegm, respiratory symptoms or significant coughing after the procedure.  This evening she is noting secretions in her throat and having difficulty coughing them up.  The phlegm is clear no evidence of blood.  Has some discomfort in her throat and mild discomfort in her chest with swallowing but no symptoms that suggest overt perforation.  No difficulty breathing.  She was speaking comfortably in full sentences during our conversation.    She inquired if she could take Tylenol  and I advised her that she could do so but would prefer she avoid ibuprofen  given risk of bleeding after dilation.  Suggested she take a decongestant if symptoms were persisting tomorrow.  She also stated that she was not sure she would be able to work tomorrow if her symptoms were persisting.  Advised her that if she did not feel she could work she could contact our office for an extension on her work excuse.  Will request that nursing staff contact her tomorrow to follow-up on her symptoms.

## 2024-10-31 NOTE — Progress Notes (Signed)
 Vss nad trans to pacu

## 2024-10-31 NOTE — Op Note (Signed)
 [facility] Patient Name: Sonia Jackson Procedure Date: 10/31/2024 2:58 PM MRN: [id number] Endoscopist: Rosario Estefana Kidney , , [id number] Age: 69 Referring MD:  Date of Birth: 1956/06/21 Gender: Female Account #: [id number] Procedure:                Upper GI endoscopy Indications:              Dysphagia Medicines:                Monitored Anesthesia Care Procedure:                Pre-Anesthesia Assessment:                           - Prior to the procedure, a History and Physical                            was performed, and patient medications and                            allergies were reviewed. The patient's tolerance of                            previous anesthesia was also reviewed. The risks                            and benefits of the procedure and the sedation                            options and risks were discussed with the patient.                            All questions were answered, and informed consent                            was obtained. Prior Anticoagulants: The patient has                            taken no anticoagulant or antiplatelet agents. ASA                            Grade Assessment: II - A patient with mild systemic                            disease. After reviewing the risks and benefits,                            the patient was deemed in satisfactory condition to                            undergo the procedure.                           After obtaining informed consent, the endoscope was  passed under direct vision. Throughout the                            procedure, the patient's blood pressure, pulse, and                            oxygen saturations were monitored continuously. The                            Olympus Scope SN [id number] was introduced through the                            mouth, and advanced to the second part of duodenum.                            The upper GI endoscopy was  accomplished without                            difficulty. The patient tolerated the procedure                            well. 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 This stenosis measured 1 cm (in                            length). The stenosis was traversed. A guidewire                            was placed and the scope was withdrawn. Dilation                            was performed with a Savary dilator with mild                            resistance at 19 mm. The dilation site was examined                            following endoscope reinsertion and showed mild                            mucosal disruption.                           Biopsies were taken with a cold  forceps in the                            proximal esophagus and in the distal esophagus for                            histology.                           Localized mild inflammation characterized by                            congestion (edema) and erythema was found in the                            gastric antrum. Biopsies were taken with a cold                            forceps for histology.                           The examined duodenum was normal. Complications:            No immediate complications. Estimated Blood Loss:     Estimated blood loss was minimal. Impression:               - Benign-appearing esophageal stenosis. Dilated.                           - Gastritis. Biopsied.                           - Normal examined duodenum.                           - Biopsies were taken with a cold forceps for                            histology in the proximal esophagus and in the                            distal esophagus. Recommendation:           - Discharge patient to home  (with escort).                           - Await pathology results.                           - Increase pantoprazole  dosage to 40 mg daily for 8                            weeks, then decrease to 20 mg daily.                           - Return to GI clinic in 2-3 months.                           -  The findings and recommendations were discussed                            with the patient. Dr Estefana Federico Rosario Estefana Federico,  10/31/2024 3:30:51 PM

## 2024-10-31 NOTE — Progress Notes (Signed)
 Pt's states no medical or surgical changes since previsit or office visit.

## 2024-11-01 ENCOUNTER — Telehealth: Payer: Self-pay

## 2024-11-01 NOTE — Telephone Encounter (Signed)
" °  Follow up Call-     10/31/2024    2:14 PM 05/20/2023    9:15 AM  Call back number  Post procedure Call Back phone  # [phone]  Permission to leave phone message Yes Yes     Patient questions:  Do you have a fever, pain , or abdominal swelling? No. Pain Score  0 *  Have you tolerated food without any problems? No.  Have you been able to return to your normal activities? No.  Do you have any questions about your discharge instructions: Diet   No. Medications  No. Follow up visit  No.  Do you have questions or concerns about your Care? No.  Actions: * If pain score is 4 or above: No action needed, pain <4.  Pt states she had called last night, states she is feeling better but only had broth yesterday, no food, instructed pt to eat something soft today like oatmeal, scrambled egg or mashed potatoes and see how she tolerated it, denies n/v, no pain at this time but had chest pain and sore throat yesterday. Pt states she had to call off of work and request Dr note for today.  Let pt know I will put dr note at the 2nd floor reception, pt does not do mychart, instructed her to call office if any increase in pain, any difficulty with food or swallowing, etc, pt verb understanding.   "

## 2024-11-02 NOTE — Telephone Encounter (Signed)
 Left message for patient to call back

## 2024-11-03 ENCOUNTER — Ambulatory Visit: Payer: Self-pay | Admitting: Internal Medicine

## 2024-11-03 LAB — SURGICAL PATHOLOGY

## 2024-11-03 NOTE — Telephone Encounter (Signed)
 Pt stated that she still has a little soreness in her throat and chest when she swallows food. Pt stated that she is doing better and her symptoms are continuing to resolve.  Pt was notified to give our office a call back on Monday if her symptoms persist. Pt verbalized understanding with all questions answered.  Routed as FYI

## 2024-11-03 NOTE — Telephone Encounter (Signed)
 Left message for pt to call back

## 2024-11-28 ENCOUNTER — Emergency Department (HOSPITAL_BASED_OUTPATIENT_CLINIC_OR_DEPARTMENT_OTHER)
Admission: EM | Admit: 2024-11-28 | Discharge: 2024-11-28 | Disposition: A | Discharge status: Home / Self Care | Attending: Emergency Medicine | Admitting: Emergency Medicine

## 2024-11-28 ENCOUNTER — Other Ambulatory Visit: Payer: Self-pay

## 2024-11-28 ENCOUNTER — Emergency Department (HOSPITAL_BASED_OUTPATIENT_CLINIC_OR_DEPARTMENT_OTHER)

## 2024-11-28 ENCOUNTER — Encounter (HOSPITAL_BASED_OUTPATIENT_CLINIC_OR_DEPARTMENT_OTHER): Payer: Self-pay | Admitting: Emergency Medicine

## 2024-11-28 ENCOUNTER — Emergency Department (HOSPITAL_BASED_OUTPATIENT_CLINIC_OR_DEPARTMENT_OTHER): Admitting: Radiology

## 2024-11-28 DIAGNOSIS — S3011XA Contusion of abdominal wall, initial encounter: Secondary | ICD-10-CM | POA: Insufficient documentation

## 2024-11-28 DIAGNOSIS — M542 Cervicalgia: Secondary | ICD-10-CM | POA: Insufficient documentation

## 2024-11-28 DIAGNOSIS — Y92410 Unspecified street and highway as the place of occurrence of the external cause: Secondary | ICD-10-CM | POA: Insufficient documentation

## 2024-11-28 DIAGNOSIS — S60221A Contusion of right hand, initial encounter: Secondary | ICD-10-CM | POA: Insufficient documentation

## 2024-11-28 DIAGNOSIS — R220 Localized swelling, mass and lump, head: Secondary | ICD-10-CM | POA: Insufficient documentation

## 2024-11-28 DIAGNOSIS — R0789 Other chest pain: Secondary | ICD-10-CM | POA: Insufficient documentation

## 2024-11-28 MED ORDER — LIDOCAINE 5 % EX PTCH: 1.0000 | MEDICATED_PATCH | CUTANEOUS | 0 refills | Status: AC

## 2024-11-28 MED ORDER — METHOCARBAMOL 500 MG PO TABS: 500.0000 mg | ORAL_TABLET | Freq: Three times a day (TID) | ORAL | 0 refills | Status: AC | PRN

## 2024-11-28 MED ORDER — OXYCODONE-ACETAMINOPHEN 5-325 MG PO TABS
1.0000 | ORAL_TABLET | Freq: Once | ORAL | Status: AC
  Administered 2024-11-28: 1 via ORAL
  Filled 2024-11-28: qty 1
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
## 2024-11-28 NOTE — ED Notes (Signed)
 Patient transported to X-ray

## 2025-01-02 ENCOUNTER — Ambulatory Visit: Admitting: Internal Medicine

## 2025-01-17 ENCOUNTER — Ambulatory Visit: Admitting: Internal Medicine
